# Patient Record
Sex: Male | Born: 1945 | Race: White | Hispanic: No | Marital: Married | Smoking: Current every day smoker
Health system: Southern US, Community
[De-identification: ages and names within clinical notes are randomized; demographics above are authoritative.]

## PROBLEM LIST (undated history)

## (undated) DIAGNOSIS — Z9289 Personal history of other medical treatment: Secondary | ICD-10-CM

## (undated) DIAGNOSIS — M479 Spondylosis, unspecified: Secondary | ICD-10-CM

## (undated) DIAGNOSIS — M21371 Foot drop, right foot: Secondary | ICD-10-CM

## (undated) DIAGNOSIS — I10 Essential (primary) hypertension: Secondary | ICD-10-CM

## (undated) DIAGNOSIS — I2510 Atherosclerotic heart disease of native coronary artery without angina pectoris: Secondary | ICD-10-CM

## (undated) HISTORY — DX: Personal history of other medical treatment: Z92.89

## (undated) HISTORY — PX: FOOT SURGERY: SHX648

## (undated) HISTORY — DX: Atherosclerotic heart disease of native coronary artery without angina pectoris: I25.10

## (undated) HISTORY — DX: Spondylosis, unspecified: M47.9

## (undated) HISTORY — DX: Essential (primary) hypertension: I10

## (undated) HISTORY — PX: CATARACT EXTRACTION, BILATERAL: SHX1313

## (undated) HISTORY — PX: CORONARY STENT PLACEMENT: SHX1402

## (undated) HISTORY — PX: BACK SURGERY: SHX140

## (undated) HISTORY — PX: CHOLECYSTECTOMY OPEN: SUR202

---

## 2000-11-28 ENCOUNTER — Ambulatory Visit (HOSPITAL_COMMUNITY): Admission: RE | Admit: 2000-11-28 | Discharge: 2000-11-28 | Payer: Self-pay | Admitting: Neurosurgery

## 2000-11-28 ENCOUNTER — Encounter: Payer: Self-pay | Admitting: Neurosurgery

## 2001-01-11 ENCOUNTER — Encounter: Admission: RE | Admit: 2001-01-11 | Discharge: 2001-01-19 | Payer: Self-pay | Admitting: Anesthesiology

## 2001-09-09 ENCOUNTER — Ambulatory Visit (HOSPITAL_COMMUNITY): Admission: RE | Admit: 2001-09-09 | Discharge: 2001-09-10 | Payer: Self-pay | Admitting: Cardiology

## 2002-05-29 ENCOUNTER — Ambulatory Visit (HOSPITAL_COMMUNITY): Admission: RE | Admit: 2002-05-29 | Discharge: 2002-05-29 | Payer: Self-pay | Admitting: Neurosurgery

## 2002-05-29 ENCOUNTER — Encounter: Payer: Self-pay | Admitting: Neurosurgery

## 2002-05-31 ENCOUNTER — Emergency Department (HOSPITAL_COMMUNITY): Admission: EM | Admit: 2002-05-31 | Discharge: 2002-06-01 | Payer: Self-pay | Admitting: Emergency Medicine

## 2002-07-10 ENCOUNTER — Encounter: Admission: RE | Admit: 2002-07-10 | Discharge: 2002-07-10 | Payer: Self-pay | Admitting: Orthopaedic Surgery

## 2002-07-10 ENCOUNTER — Encounter: Payer: Self-pay | Admitting: Orthopaedic Surgery

## 2002-08-25 ENCOUNTER — Encounter: Payer: Self-pay | Admitting: Orthopaedic Surgery

## 2002-08-27 ENCOUNTER — Inpatient Hospital Stay (HOSPITAL_COMMUNITY): Admission: RE | Admit: 2002-08-27 | Discharge: 2002-09-02 | Payer: Self-pay | Admitting: Orthopaedic Surgery

## 2002-08-27 ENCOUNTER — Encounter: Payer: Self-pay | Admitting: Orthopaedic Surgery

## 2002-08-29 ENCOUNTER — Encounter: Payer: Self-pay | Admitting: Orthopaedic Surgery

## 2002-09-01 ENCOUNTER — Encounter: Payer: Self-pay | Admitting: Orthopaedic Surgery

## 2002-09-18 ENCOUNTER — Ambulatory Visit: Admission: RE | Admit: 2002-09-18 | Discharge: 2002-09-18 | Payer: Self-pay | Admitting: Orthopaedic Surgery

## 2010-02-28 ENCOUNTER — Ambulatory Visit: Payer: Self-pay | Admitting: Cardiology

## 2010-10-07 NOTE — Discharge Summary (Signed)
[name], [name]                           ACCOUNT NO.:  [id number]   MEDICAL RECORD NO.:  [id number]                   PATIENT TYPE:  INP   LOCATION:  5021                                 FACILITY:  [facility]   PHYSICIAN:  Sharolyn Douglas, M.D.                     DATE OF BIRTH:  1946/03/02   DATE OF ADMISSION:  08/27/2002  DATE OF DISCHARGE:  09/02/2002                                 DISCHARGE SUMMARY   ADMITTING DIAGNOSIS:  1. 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  2. Hypertension.  3. Coronary artery disease.   DISCHARGE DIAGNOSES:  1. Status post L2-5 revision laminectomy and L2-5 posterior spinal fusion     with pedicle screws.  2. Postoperative hemorrhagic anemia that was stable, asymptomatic and did     not require any blood transfusions.  3. Postoperative mild ileus, resolved prior to discharge.  4. 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  5. Hypertension.  6. Coronary artery disease.   PROCEDURE:  L2-to-L5 revision laminectomy, L2-to-L5 posterior spinal fusion  with pedicle screws; this was done by Dr. Sharolyn Douglas with the assistance of  [facility]  Anesthesia used was general.   CONSULTS:  None.   BRIEF HISTORY:  The patient is a 65 year old man with a long history of back  complaints dating back to 1978 when he underwent L5-S1 laminotomy and  diskectomy for radiculopathy.  He did well following that operation, until  1981, when he developed back and right hip pain.  The patient went on to  have revision laminotomy and posterior spinal fusion, indicating that he had  some improvement since then, however, not complete.  In 1990, he developed  radicular pain, weakness in right lower extremity and foot drop and he  underwent an L4-L5 lumbar laminotomy and diskectomy, postoperatively had  worsening of the weakness in the right lower extremity.  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  He has tried treatments with multiple epidural steroid  injections as well as trigger point injections and excalating doses of  narcotic pain medications, all without any significant relief.  He describes  the pain as a 10/10 on the visual analog scale, is constant in nature,  keeping him up at night, interferes with daily activities as well as work.  Risks and benefits of the procedure were discussed with the patient by Dr.  Sharolyn Douglas.  He indicated understanding and elected to proceed.   LABORATORY DATA:  On August 25, 2002, CBC with differential was within normal  limits with the exception of RDW percent of 15.4, absolute monos of 1, basos  of 2, and absolute basos of 0.2.  CBC postoperatively showed white count of  17.9, red count of 4.01, MCV 100.1, RDW 15, hemoglobin and hematocrit  monitored three  days postoperatively.  It reached a low of 12.0 and 35.4,  respectively, however, he was symptomatic and did not require transfusion.  He remained stable.  PT/INR and PTT on August 25, 2002 within normal limits.  Complete metabolic panel on August 25, 2002 was within normal limits.  Basic  metabolic panel was monitored three days postoperatively.  He had a slight  elevation in his glucose at 147, 156, and 134 during those three days.  He  did have mild hyponatremia at 134 on August 30, 2002.  His BUN was elevated  at 24 on that day as well.  UA from August 25, 2002 was negative.  Blood type  on August 25, 2002 was type A, Rh-positive, antibody screen negative.   On August 29, 2002, portable abdomen showed a dynamic ileus with distended  loops of small and large bowel but negative for free air.  This was repeated  on September 01, 2002 and showed improvement but not complete resolution of  diffuse ileus.   EKG:  There is not one found on the chart.   HOSPITAL COURSE:  Following admission, the patient was taken for the above-  listed procedure.  He tolerated the procedure well without any   intraoperative complications.  He had 1200 mL of blood loss with 450  returned via Cell Saver and one Hemovac drain was placed.  He was  transferred to the recovery room in stable condition.  Upon being admitted  to the hospital, his vital signs were monitored.  Neurovascular checks were  done and he remained intact without any compromise throughout his hospital  stay.  His diet was held at n.p.o. until he passed flatus, at which time he  began having a clear liquid diet.  However, secondary to abdominal  distention, an abdominal x-ray was ordered as previously dictated and he was  found to have an ileus, therefore, he was put back n.p.o.  He was held at  n.p.o. until he had a bowel movement, at which time he was slowly advanced  and did not have any other problems.  Incentive spirometry was used every  one hour.  Foley catheter was inserted preoperatively and continued and  discontinued on postoperative day 2 in the a.m. without any problems.  Kept  is SCDs with mobilization with decreased chance of any DVTs.  Forty-eight  hours of IV antibiotics were completed without difficulty.  Pain control was  obtained initially on a combination of PCA as well as p.o. Percocet.  Pain  control remained adequate throughout his hospital stay.  Home medications  were restarted postoperatively.  Physical therapy and occupational therapy  were consulted to work with the patient on progressive ambulation program,  safety and gait training, as well as back precautions; he did well with them  and was essentially independent prior to discharge.  Hemovac drain was  discontinued on postoperative day 2 without any difficulty.  Operative  dressing was taken down, postoperative day 2, and incision looked good  without any signs or symptoms of infections.  Daily dressing changes were  done thereafter and he continued to look good throughout his hospital stay. Postoperative day 2 was the day that he developed some  abdominal distention.  Postoperative day 3, he did have a bowel movement and by postoperative day  4, he was feeling much better, however, he develop some mild distention of  his abdomen.  He did have another bowel movement on postoperative day 4.  By  postop day  5, he was doing very well and feeling well.  He had had three  bowel movements and his abdomen was not distended and nontender.  His vital  signs were stable and he was afebrile.  Motor and sensation were intact and  unchanged from his preoperative status.  Incision looked good without any  signs of infection.   DISCHARGE /DIAGNOSIS:  [age]-year-old male, status post posterior  spinal fusion.   ACTIVITY:  Progressive ambulation, no lifting greater than 5 pounds.  Back  precautions were discussed and understood.   WOUND CARE:  Dressing changes on a daily basis.  He may shower if there is  no drainage from the incision.   FOLLOWUP:  He is to follow up in two weeks postoperatively with Dr. Noel Gerold  and instructed to call for an appointment.   MEDICATIONS ON DISCHARGE:  1. Mepergan Fortis.  2. Robaxin.  3. Over-the-counter laxative as needed.  4. Over-the-counter Tylenol as needed.  5. Calcium with vitamin D.  6. Multivitamin.  7. Colace.   DIET:  Diet as tolerated, regular home diet.   CONDITION ON DISCHARGE:  Stable and improved.   DISPOSITION:  The patient is being discharged to his home with his family's  assistance.  Home needs and equipment were arranged through [facility].  The patient has met all orthopedic goals and was medically stable and  ready for discharge.      Verlin Fester, P.A.                       Sharolyn Douglas, M.D.    CM/MEDQ  D:  10/09/2002  T:  10/10/2002  Job:  [id number]

## 2010-10-07 NOTE — Discharge Summary (Signed)
[place]. [facility]  Patient:    [name], [name] Visit Number: [id number] MRN: [id number]          Service Type: CAT Location: 6500 [id number] Attending Physician:  [place], Peter Manning Dictated by:   Peter M. [place], M.D. Admit Date:  09/09/2001 Discharge Date: 09/10/2001   CC:         Hadassah Pais. Jeannetta Nap, M.D.   Discharge Summary  HISTORY OF PRESENT ILLNESS:  Mr. Golubski is a 65 year old white male seen for evaluation of chest pain.  He began experiencing symptoms of substernal chest pain in March associated with feeling hot, clammy, and nauseated.  Subsequent Adenosine Cardiolite study showed a fixed apical defect with normal left ventricular function and ejection fraction of 54%.  Due to recurrent chest pain, he was admitted for cardiac catheterization.  The patient does have a history of severe hypertension that has been difficult to control.  PAST MEDICAL HISTORY: SOCIAL HISTORY: FAMILY HISTORY: PHYSICAL EXAMINATION:  For details, please see admission history and physical.  LABORATORY DATA:  EKG showed normal sinus rhythm with borderline interventricular conduction delay.  Chest x-ray showed no active disease. BMET showed a glucose 183, hemoglobin A1c 5.1%.  Other chemistries were normal.  CBC was normal.  Coags were normal.  Triglycerides were 209.  Total cholesterol was 205 with LDL 96, HDL 67.  HOSPITAL COURSE:  The patient was admitted and underwent cardiac catheterization.  This demonstrated single vessel obstructive disease with tandem lesions in the mid and distal right coronary artery.  Both of these lesions were approximately 70%.  The more proximal lesion was very irregular. The second lesion was more fusiform.  Left ventricular function was normal. Abdominal aortography was also normal showing normal renal artery anatomy.  We proceeded at this point with stenting of the right coronary artery.  The lesion at the crux of the right coronary  artery was stented using a 3.0 x 15 mm Zeta stent.  The more proximal lesion was stented using a 3.0 x 13 mm Zeta. This yielded excellent angiographic result.  The patient tolerated the procedure well.  He had no recurrent chest pain.  Follow-up EKG was normal. He did develop a moderate hematoma in his right groin site.  However, at the time of discharge this was soft and nontender.  Pulses were good and he had no bruit. His hemoglobin remained stable at 15.2.  It was felt that the patient was stable for discharge at this point.  The patient did remain hypertensive during his hospital stay.  We recently added Toprol XL to his medical regimen, so will continue with his multidrug regimen for control of his hypertension.  DISCHARGE DIAGNOSES: 1. Angina pectoris. 2. Atherosclerotic coronary artery disease, single vessel disease, status post    successful stenting of the right coronary artery. 3. Severe hypertension. 4. Chronic back pain.  DISCHARGE MEDICATIONS: 1. Trazodone 25 mg q.h.s. 2. Darvocet-N 100 p.r.n. 3. Verapamil 180 mg twice a day. 4. Altace 10 mg twice a day. 5. Toprol XL 50 mg per day. 6. Hydrochlorothiazide 12.5 mg daily. 7. Coated aspirin 325 mg daily. 8. Plavix 75 mg daily for one month.  ACTIVITY:  The patient is to avoid any lifting or straining for one week.  He may return to work on Friday.  DIET:  He is to follow a low fat, low salt diet.  WOUND CARE:  He is to call if he has any increased groin pain or swelling.  FOLLOW-UP:  Will follow up in one week with Dr. [place].  CONDITION ON DISCHARGE:  Improved. Dictated by:   Peter M. [place], M.D. Attending Physician:  [place], Peter Manning DD:  09/10/01 TD:  09/10/01 Job: [id number]

## 2010-10-07 NOTE — Cardiovascular Report (Signed)
[place]. [facility]  Patient:    GIANNIS, CORPUZ Visit Number: [id number] MRN: [id number]          Service Type: CAT Location: 6500 [id number] Attending Physician:  [place], Peter Manning Dictated by:   Peter M. [place], M.D. Proc. Date: 09/09/01 Admit Date:  09/09/2001   CC:         Buren Kos, M.D.   Cardiac Catheterization  INDICATIONS FOR PROCEDURE:  The patient is a 65 year old white male who presents with refractory chest pain.  Previous adenosine Cardiolite study showed fixed apical defect.  The patient also has a history of severe hypertension.  ACCESS:  Via right femoral artery using standard Seldinger technique.  EQUIPMENT: 1. 6-French 4 cm right and left Judkins catheters. 2. 6-French pigtail catheter. 3. 6-French arterial sheath. 4. 7-French arterial sheath. 5. 7-French JR4 guide with side holes. 6. 0.014 Hi-Torque floppy wire. 7. 3.0 x 15 mm Zeta stent. 8. 3.0 x 13 mm Zeta stent.  CONTRAST:  310 cc Omnipaque.  MEDICATIONS: 1. Local anesthesia 1% Xylocaine. 2. Versed 2 mg IV. 3. Heparin 4000 units IV. 4. Nitroglycerin 200 mcg intracoronary x2. 5. Integrilin double bolus, followed by continuous IV infusion. 6. Nitroglycerin drip at 10 mcg/min IV. 7. Plavix 300 mg p.o.  HEMODYNAMIC DATA: 1. Aortic pressure:  145/64 with mean 103. 2. Left ventricular pressure:  145 with EDP 27.  ANGIOGRAPHIC DATA:  The left coronary artery arises and distributes normally. 1. LEFT MAIN CORONARY:  Normal. 2. LEFT ANTERIOR DESCENDING ARTERY:  Has 30% disease proximally.  In the mid    vessel at an angle there is a 40% stenosis of the LAD. 3. LEFT CIRCUMFLEX CORONARY ARTERY:  Demonstrates mild, diffuse irregularities    less than 10%. 4. RIGHT CORONARY ARTERY:  Arises and distributes normally.  It is a    dominant vessel.  Proximal to mid vessel there is an irregular, eccentric    stenosis up to 70-80%.  At the crux of the right coronary artery  there is    a long tubular 60-70% stenosis.  There is a 40% stenosis in the PDA.  LEFT VENTRICULOGRAPHY:  Performed in the RAO view.  Demonstrates normal left ventricular size and function, with ejection fraction estimated at 65%.  There is no mitral valve prolapse or regurgitation.  ABDOMINAL AORTOGRAPHY:  This demonstrated normal aortic caliber, without aneurysmal formation.  The renal arteries are very large in caliber, without significant stenosis.  The celiac arteries also appear normal.  The iliac arteries are tortuous, without significant disease.  INTERVENTIONAL PROCEDURE:  We proceeded at this point with intervention of the right coronary artery stenoses.  After the patient was medicated we directly stented both the lesions in the right coronary artery, both in the proximal to mid vessel and at the crux.  The lesion in the crux was stented first, using the 3.0 x 15 mm Zeta stent.  This was deployed at 8 atm and post-dilated to 14 atm, with an excellent angiographic result (less than 0% residual stenosis).  We then proceeded with stenting in the proximal lesion.  This was stented using a 3.0 x 13 mm Zeta stent.  It was deployed at 8 atm and then post-dilated to 14 atm, and then to 16 atm.  Again, an excellent angiographic result was obtained, with 0% residual stenosis.  FINAL INTERPRETATION: 1. Single-vessel obstructive atherosclerotic coronary artery disease. 2. Normal left ventricular function. 3. Normal abdominal aortography. 4. Successful stenting of tandem  lesions in the right coronary artery. Dictated by:   Peter M. [place], M.D. Attending Physician:  [place], Peter Manning DD:  09/09/01 TD:  09/10/01 Job: [id number]

## 2010-10-07 NOTE — Op Note (Signed)
   NAME:  [name], [name]                           ACCOUNT NO.:  [id number]   MEDICAL RECORD NO.:  [id number]                   PATIENT TYPE:  INP   LOCATION:  5021                                 FACILITY:  [facility]   PHYSICIAN:  Sharolyn Douglas, M.D.                     DATE OF BIRTH:  1946/05/18   DATE OF PROCEDURE:  08/27/2002  DATE OF DISCHARGE:                                 OPERATIVE REPORT   ADDENDUM:  It should be noted that the L5-S1 fusion was explored by exposing  the previous fusion mask.  No clefts were identified.  The spinous process  of L5 and S1 was grasped with a towel clip and we could not produce any  motion at this level.  The fusion appeared to be solid.                                               Sharolyn Douglas, M.D.    MC/MEDQ  D:  08/27/2002  T:  08/28/2002  Job:  [id number]

## 2010-10-07 NOTE — Op Note (Signed)
NAME:  [name], [name]                           ACCOUNT NO.:  [id number]   MEDICAL RECORD NO.:  [id number]                   PATIENT TYPE:  INP   LOCATION:  5021                                 FACILITY:  [facility]   PHYSICIAN:  Sharolyn Douglas, M.D.                     DATE OF BIRTH:  1946-01-25   DATE OF PROCEDURE:  08/27/2002  DATE OF DISCHARGE:                                 OPERATIVE REPORT   PREOPERATIVE DIAGNOSIS:  1. Chronic back and right lower extremity pain secondary to severe     degenerative disk disease and spinal stenosis.  2. Status post L4-5 and L5-S1 laminectomy and L5-S1 in situ.  3. Chronic right foot drop.   POSTOPERATIVE DIAGNOSIS:  1. Chronic back and right lower extremity pain secondary to severe     degenerative disk disease and spinal stenosis.  2. Status post L4-5 and L5-S1 laminectomy and L5-S1 in situ.  3. Chronic right foot drop.   OPERATION PERFORMED:  1. Revision, lumbar laminectomy, L2 through L5, decompression of the L2, 3,     4, and 5 nerve roots bilaterally.  2. Exploration of fusion of L5-S1.  3. Posterior spinal arthrodesis L2 through L5.  4. Pedicle screw instrumentation segmental L2 through L5 utilizing the     Spinal Concepts and Compass system.  5. Local autogenous bone graft supplemented with 10 cc with Pro-Osteon bone     graft substitute and platelet rich plasma.  6. Neuro monitoring utilizing the NeuroVision system with testing of eight     pedicle screws and monitoring of free running EMGs for three hours.   SURGEON:  Sharolyn Douglas, M.D.   ASSISTANT:  Real Cons, P.A.   ANESTHESIA:  General endotracheal.   COMPLICATIONS:  None.   INDICATIONS FOR PROCEDURE:  The patient is a 65 year old male who is status  post three lumbar surgeries including L5-S1 laminectomies, diskectomy and in  situ fusion followed by an L4-5 lumbar laminotomy and diskectomy.  He has a  chronic right foot drop status post his third lumbar surgery.  Over the  past  several years, he has developed increasing low back pain with intractable  pain in the right lower extremity.  He has been taking escalating doses of  narcotics.  There has been significant deterioration in his function.  He is  ambulating with pain, having difficulties performing his activities of daily  living.  He was worked up with CT myelogram, MRI scan and plain x-rays.  He  has severe degenerative changes throughout the lumbar spine.  There are  varying degrees of foraminal and lateral recess stenosis, L2 through S1.  There is an extruded disk fragment at L3-4 on the right.  There is epidural  fibrosis about the previous operated levels.  Because of his continued pain  and failure to respond to conservative treatment.  He has elected to undergo  revision, lumbar decompression and fusion, L2 to L5 with exploration of the  L5-S1 fusion in hopes of improving his symptoms.   DESCRIPTION OF PROCEDURE:  The patient was properly identified in the  holding area and taken to the operating room. He underwent general  endotracheal anesthesia without difficulty.  He was given prophylactic  intravenous antibiotics.  He was carefully turned prone onto the Acromed  four poster positioning free.  All bony prominences were padded.  The face  and eyes were protected at all times.  The previous midline incision was  utilized and extended several centimeters proximally.  Dissection was  carried down to the deep fascia.  The deep fascia was incised and the  paraspinal muscles were subperiosteally elevated out to the tips of the  transverse processes of L2, 3, and 4.  There was an extensive amount of  scarring about the L5-S1 level.  The transverse processes of L5 were encased  in the previous fusion mass.  The facet joints were found to be very  hypertrophic at L2-3, L3 and L4-5.  We placed our deep retractor.  We  identified the previous laminectomy defect at L4-5 and L5-S1 using a curet.  There  was an extensive amount of epidural fibrosis.  The spinal canal was  entered and using Kerrisons, and curets we were able to perform a revision  central decompression.  We removed the residual lamina of L4 and 5.  We then  performed a laminectomy of L2 and 3.  The interspinous ligament was  preserved between L1 and 2.  We removed approximately half of the L2 spinous  process and inferior one third of the L2 lamina.  We then performed a  lateral recess decompression bilaterally.  On the right side, we encountered  scarring, worse at L3-4 and L4-5.  Using loupes and headlight magnification,  we performed a neurolysis, completely decompressing the L2, 3, 4, and 5  nerve roots bilaterally.  An extruded disk fragment was identified at the L3-  4 level with compression on the L4 nerve root.  This was removed using  pituitary rongeurs.  We were not able to enter the disk space because of  posterior osteophytes.  When we were done with the decompression, each nerve  was completely free from its take off all the way out its respective foramen  which we confirmed with a blunt probe.  We then turned our attention to  placing pedicle screws at L2, 3, 4, and 5 using anatomic probing technique  and fluoroscopy.  Each pedicle starting point was identified and initiated  with the awl.  The Steffee pedicle probe was used to cannulate the pedicle.  We used a ball tip probe to confirm there were no breaches.  We could also  palpate the pedicles from within the spinal canal.  We then tapped and  placed the screws.  We placed 5.5 x 45 mm screws at L2 bilaterally, 5.5 x 45  mm screws at L3 bilaterally, 6.5 x 50 mm screws at L4 and 6.5 x 45 mm screws  at L5 bilaterally.  Each pedicle screw was then stimulated using triggered  EMGs and in each case we had a response at greater than 20 ma consistent  with interosseous placement of the screws.  We then placed the titanium rods and the appropriate locking screws.  The  locking screws were torqued off.  We placed two cross connectors.  We then decorticated the transverse  processes of L2, 3,  4 as well as the previous fusion mass at L5.  The pars  interarticularis and the lateral facets were also decorticated.  We then  packed local autogenous bone graft that had been collected from the  laminectomy defect and cleaned of all soft tissue, mixed with 10 ml of Pro-  Osteon bone graft substitute and platelet rich plasma into the lateral  gutters. We inspected the dura where the neurolysis had been performed.  There were no lacerations or leakage of spinal fluid.  Nevertheless we  placed a seal over the exposed epidural space as there were several areas of  pain where the epidural fibrosis had been dissected.  We then placed Gelfoam  over the dura.  A deep Hemovac drain was placed.  The platelet poor plasma  was then sprayed into the muscle laterally and soft tissues.  There were no  deleterious changes in the free running EMGs throughout the procedure.  We  then closed the wound in layers with a #1 Vicryl in the deep fascia followed  by a 2-0 Vicryl in the subcutaneous layer and a running 3-0 nylon to  approximate the skin.  Once again, the platelet poor plasma was sprayed over  the incision.  A sterile dressing was applied.  The patient was turned  supine, extubated without difficulty, transferred to the recovery room in  stable condition able to move his upper and lower extremities.                                                Sharolyn Douglas, M.D.    MC/MEDQ  D:  08/27/2002  T:  08/28/2002  Job:  [id number]

## 2010-10-07 NOTE — H&P (Signed)
NAME:  [name], [name]                           ACCOUNT NO.:  [id number]   MEDICAL RECORD NO.:  [id number]                   PATIENT TYPE:  INP   LOCATION:  5021                                 FACILITY:  [facility]   PHYSICIAN:  Sharolyn Douglas, M.D.                     DATE OF BIRTH:  04-12-1946   DATE OF ADMISSION:  08/27/2002  DATE OF DISCHARGE:                                HISTORY & PHYSICAL   CHIEF COMPLAINT:  Back and right lower extremity pain.   HISTORY OF PRESENT ILLNESS:  The patient is a 65 year old male who has had a  long history of back complaints dating back to 1978 when he underwent an L5-  S1 laminotomy and diskectomy for radiculopathy.  He did well following that  operation until 1981.  He developed back and right hip pain.  He eventually  went on to a revision laminotomy and posterior fusion.  Again, he had some  improvement with his symptoms although not complete.  In 1990, he developed  radicular pain, weakness in the right lower extremity with foot drop and he  underwent L4-5 lumbar laminotomy and diskectomy and postoperatively had a  worsening of the weakness.  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  He describes his pain as a 10/10 on a visual analog  scale.  Secondary to the patient's findings on the CT myelogram, as well as  his clinical findings and history, it was thought that his best course of  management would be a revision laminectomy at L2-5 and a posterior spinal  fusion at L3-5.  Risks and benefits of the surgery were discussed with the  patient by Dr. Sharolyn Douglas.  He indicated understanding and opted to proceed.   ALLERGIES:  No known drug allergies.   MEDICATIONS:  1. Norvasc 5 mg p.o. daily.  2. Lisinopril 40 mg p.o. daily.  3. Toprol XL 50 mg p.o. daily.  4.  Hydrochlorothiazide 25 mg p.o. daily.  5. Trazodone 25 mg p.o. daily.  6. Prednisone up to 60 mg p.o. daily.  7. Vicodin p.r.n.  8. Aspirin, which he stopped the aspirin in preparation for surgery.   PAST MEDICAL HISTORY:  1. Hypertension.  2. Coronary artery disease with two stents.   PAST SURGICAL HISTORY:  Back surgery in 1978, 1982, and 1990.  Cholecystectomy, as well as cardiac stent placement in April 2003.   SOCIAL HISTORY:  The patient smokes one pack of cigarettes per day.  He is  trying to cut down and quit in preparation for surgery.  Alcohol:  He drinks  rarely on a social basis.  He is married.  Currently unemployed secondary to  his disabling back problems.  His  wife will be available to help him through  his postoperative course.   FAMILY HISTORY:  Noncontributory.   REVIEW OF SYSTEMS:  The patient denies any fevers, chills, sweats, or  bruising tendencies.  CNS:  Denies blurred vision, double vision, seizure,  headache, paralysis.  CARDIOVASCULAR:  Denies chest pain, angina, orthopnea,  claudication, or palpitations.  PULMONARY:  Denies shortness of breath or  hemoptysis.  GI:  Denies nausea, vomiting, constipation, diarrhea, melena,  or bloody stool.  GU:  Denies dysuria, hematuria, or discharge.  MUSCULOSKELETAL:  As per HPI.   PHYSICAL EXAMINATION:  VITAL SIGNS:  Blood pressure is 180/100, respirations  at 18 and unlabored, pulse is 82 and regular.  GENERAL:  This is a 65 year old white male who is alert and oriented in no  acute distress.  He is well-nourished, well-developed, appears stated age.  Pleasant and cooperative to exam.  He is a bit anxious throughout our  conversation today.  HEENT:  Head is normocephalic, atraumatic.  Pupils are equal, round, and  reactive.  Extraocular movements intact.  Nares patent.  Fundus clear.  NECK:  Soft to palpation.  No bruits or lymphadenopathy or thyromegaly  noted.  CHEST:  Clear to auscultation bilaterally.  No  rales, rhonchi, or wheezes.  BREASTS:  Not pertinent, not performed.  HEART:  S1, S2.  Regular rate and rhythm.  No murmurs, gallops, or rubs  noted.  ABDOMEN:  Soft to palpation, nontender, nondistended, no organomegaly noted.  GENITOURINARY:  Not pertinent, not performed.  EXTREMITIES:  Motor and sensation are grossly intact; however, he does have  right-sided foot drop.  Antalgic gait on the right.  He has 0/5 strength in  the right tibialis anterior and 2/5 in the right EHL.  Gastrocnemius is 4/5.  He has 4-/5 quadriceps strength.  This is all on the right side.  Straight  leg raising is positive on the right for leg pain.  Sensation is decreased  in the right L5 distribution.  Reflexes are 2+ at the knees, hypoactive at  the ankles.  SKIN:  Intact without lesions or rashes.   LABORATORY DATA:  X-ray and CT myelogram shows degenerative disk disease and  spinal stenosis.   IMPRESSION:  1. 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  2. Hypertension.  3. Coronary artery disease.   PLAN:  1. The patient will be admitted to [facility] on [facility] 27, 2002 to     undergo an L2-5 revision laminectomy, as well as L2-5 posterior spinal     fusion.  It will be done by Dr. Sharolyn Douglas.  2. The patient's primary care physician is Dr. Jeannetta Nap.  3. The patient's cardiologist is Dr. [place] and he has cleared him in     preparation for surgery.  Should we run into any medical or cardiac     problems, we will consult the appropriate physician.     Verlin Fester, P.A.                       Sharolyn Douglas, M.D.    CM/MEDQ  D:  08/27/2002  T:  08/28/2002  Job:  [id number]

## 2010-10-07 NOTE — H&P (Signed)
[name]. [facility]  Patient:    [name], [name] Visit Number: [id number] MRN: [id number]          Service Type: CAT Location: [id number] Attending Physician:  Moran, Donald Manning Dictated by:   Donald M. Moran, M.D. Admit Date:  09/09/2001 Discharge Date: 09/10/2001   CC:         [name], M.D.   History and Physical  DATE OF BIRTH:  [birth year]  HISTORY OF PRESENT ILLNESS:  [name] is a 65 year old white male seen recently for evaluation of chest pain.  The patient does have chronic back pain and has been on chronic pain medications.  In March, the patient began experiencing symptoms of substernal chest discomfort.  This was associated with feeling hot, clammy and nauseated.  His symptoms lasted 10 to 15 minutes. He had recurrent symptoms the following morning.  In addition to these symptoms, he complained of some wheezing.  He also complains of some discomfort in his upper abdomen.  His blood pressure control has been poor. The patient subsequently underwent an adenosine Cardiolite study.  This demonstrated a fixed apical defect.  There was no definite ischemia.  Left ventricular function was normal with an ejection fraction of 54%.  Since his Cardiolite study, he has had recurrent chest pain.  This is described as a substernal chest soreness.  He had some associated sweats and feeling fatigued.  Because of his refractory pain and the fact that his Cardiolite study showed an apical defect, he is now admitted for cardiac catheterization.  PAST MEDICAL HISTORY:  Past medical history is significant for severe hypertension.  The patient has chronic back problems and has had previous surgery x3.  He has had multiple epidural injections.  He has a history of chronic left foot drop.  ALLERGIES:  He has no known allergies.  CURRENT MEDICATIONS: 1. Trazodone 25 mg q.h.s. 2. Darvocet-N 100 -- up to six tablets a day. 3. Verapamil  180 mg twice a day. 4. Altace 10 mg twice a day. 5. HCTZ 12.5 daily.  ALLERGIES:  No known allergies.  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  He continues to smoke but less than one pack per day.  He denies alcohol use.  FAMILY HISTORY:  Father has had previous prior myocardial infarction; he also had cancer and diabetes.  Mother died at age 20, post redo bypass surgery.  REVIEW OF SYSTEMS:  Review of systems, as related, is significant for chronic back pain and leg weakness.  Denies orthopnea or PND.  He has had no claudication.  Other review of systems are negative.  PHYSICAL EXAMINATION:  GENERAL:  The patient is an overweight white male in no distress.  Weight is 196.  VITAL SIGNS:  Blood pressure is 186/110.  Pulse is 74 and regular.  His respirations are normal.  HEENT:  Pupils equal, round and reactive to light and accommodation. Extraocular movements are full.  Funduscopic exam reveals hypertensive AV nicking.  Oropharynx is clear.  NECK:  Without JVD, adenopathy, thyromegaly or bruits.  LUNGS:  Clear.  CARDIAC:  Exam reveals a regular rate and rhythm, positive S4 and a soft grade 1/6 systolic murmur heard best at the apex.  ABDOMEN:  Soft and nontender.  There are no masses or bruits.  EXTREMITIES:  Without edema.  NEUROLOGIC:  Exam is nonfocal.  LABORATORY AND ACCESSORY DATA:  ECG shows normal sinus rhythm with borderline  intraventricular conduction delay.  Chest x-ray shows no active disease.  IMPRESSION: 1. Chest pain.  Cardiolite study demonstrates a fixed apical defect.  Question    whether his chest pain is related to ischemia versus hypertensive heart    disease. 2. Severe hypertension. 3. Chronic back pain. 4. Previous cholecystectomy.  PLAN:  I have added Toprol-XL 50 mg per day to his antihypertensive therapy. Recommend that the patient take an aspirin daily.  He will be admitted for coronary  angiography. Dictated by:   Donald M. Moran, M.D. Attending Physician:  Moran, Donald Manning DD:  09/04/01 TD:  09/05/01 Job: [phone] [id number]

## 2011-09-13 ENCOUNTER — Encounter: Payer: Self-pay | Admitting: *Deleted

## 2011-11-09 ENCOUNTER — Encounter: Payer: Self-pay | Admitting: Cardiology

## 2012-11-11 ENCOUNTER — Ambulatory Visit
Admission: RE | Admit: 2012-11-11 | Discharge: 2012-11-11 | Disposition: A | Discharge status: Home / Self Care | Payer: Medicare Other | Source: Ambulatory Visit | Attending: Pain Medicine | Admitting: Pain Medicine

## 2012-11-11 ENCOUNTER — Other Ambulatory Visit: Payer: Self-pay | Admitting: Pain Medicine

## 2012-11-11 DIAGNOSIS — M25562 Pain in left knee: Secondary | ICD-10-CM

## 2012-11-11 DIAGNOSIS — M21371 Foot drop, right foot: Secondary | ICD-10-CM

## 2015-11-20 DIAGNOSIS — Z9289 Personal history of other medical treatment: Secondary | ICD-10-CM

## 2015-11-20 HISTORY — DX: Personal history of other medical treatment: Z92.89

## 2015-11-30 ENCOUNTER — Encounter (HOSPITAL_COMMUNITY): Payer: Self-pay | Admitting: *Deleted

## 2015-11-30 ENCOUNTER — Emergency Department (HOSPITAL_COMMUNITY): Payer: Medicare Other

## 2015-11-30 ENCOUNTER — Inpatient Hospital Stay (HOSPITAL_COMMUNITY)
Admission: EM | Admit: 2015-11-30 | Discharge: 2015-12-02 | DRG: 378 | Disposition: A | Discharge status: Home / Self Care | Payer: Medicare Other | Attending: Internal Medicine | Admitting: Internal Medicine

## 2015-11-30 DIAGNOSIS — I2510 Atherosclerotic heart disease of native coronary artery without angina pectoris: Secondary | ICD-10-CM | POA: Diagnosis present

## 2015-11-30 DIAGNOSIS — K922 Gastrointestinal hemorrhage, unspecified: Secondary | ICD-10-CM | POA: Diagnosis not present

## 2015-11-30 DIAGNOSIS — K921 Melena: Secondary | ICD-10-CM

## 2015-11-30 DIAGNOSIS — Z955 Presence of coronary angioplasty implant and graft: Secondary | ICD-10-CM

## 2015-11-30 DIAGNOSIS — E876 Hypokalemia: Secondary | ICD-10-CM | POA: Diagnosis present

## 2015-11-30 DIAGNOSIS — I10 Essential (primary) hypertension: Secondary | ICD-10-CM | POA: Diagnosis present

## 2015-11-30 DIAGNOSIS — E860 Dehydration: Secondary | ICD-10-CM | POA: Diagnosis present

## 2015-11-30 DIAGNOSIS — N179 Acute kidney failure, unspecified: Secondary | ICD-10-CM | POA: Diagnosis present

## 2015-11-30 DIAGNOSIS — D62 Acute posthemorrhagic anemia: Secondary | ICD-10-CM | POA: Diagnosis present

## 2015-11-30 DIAGNOSIS — K264 Chronic or unspecified duodenal ulcer with hemorrhage: Secondary | ICD-10-CM | POA: Diagnosis present

## 2015-11-30 DIAGNOSIS — K2970 Gastritis, unspecified, without bleeding: Secondary | ICD-10-CM | POA: Diagnosis present

## 2015-11-30 DIAGNOSIS — M545 Low back pain: Secondary | ICD-10-CM | POA: Diagnosis present

## 2015-11-30 DIAGNOSIS — G8929 Other chronic pain: Secondary | ICD-10-CM | POA: Diagnosis present

## 2015-11-30 DIAGNOSIS — F17210 Nicotine dependence, cigarettes, uncomplicated: Secondary | ICD-10-CM | POA: Diagnosis present

## 2015-11-30 DIAGNOSIS — Z7982 Long term (current) use of aspirin: Secondary | ICD-10-CM | POA: Diagnosis not present

## 2015-11-30 DIAGNOSIS — Z8249 Family history of ischemic heart disease and other diseases of the circulatory system: Secondary | ICD-10-CM

## 2015-11-30 HISTORY — DX: Foot drop, right foot: M21.371

## 2015-11-30 LAB — PREPARE RBC (CROSSMATCH)

## 2015-11-30 LAB — HEMOGLOBIN AND HEMATOCRIT, BLOOD
HCT: 20.4 % — ABNORMAL LOW (ref 39.0–52.0)
Hemoglobin: 6.4 g/dL — CL (ref 13.0–17.0)

## 2015-11-30 LAB — BASIC METABOLIC PANEL
ANION GAP: 9 (ref 5–15)
BUN: 35 mg/dL — AB (ref 6–20)
CALCIUM: 8.8 mg/dL — AB (ref 8.9–10.3)
CO2: 23 mmol/L (ref 22–32)
CREATININE: 1.68 mg/dL — AB (ref 0.61–1.24)
Chloride: 107 mmol/L (ref 101–111)
GFR calc Af Amer: 46 mL/min — ABNORMAL LOW (ref >60)
GFR, EST NON AFRICAN AMERICAN: 39 mL/min — AB (ref >60)
GLUCOSE: 121 mg/dL — AB (ref 65–99)
Potassium: 3.3 mmol/L — ABNORMAL LOW (ref 3.5–5.1)
Sodium: 139 mmol/L (ref 135–145)

## 2015-11-30 LAB — CBC
HCT: 19.9 % — ABNORMAL LOW (ref 39.0–52.0)
Hemoglobin: 6.3 g/dL — CL (ref 13.0–17.0)
MCH: 29.9 pg (ref 26.0–34.0)
MCHC: 31.7 g/dL (ref 30.0–36.0)
MCV: 94.3 fL (ref 78.0–100.0)
PLATELETS: 460 10*3/uL — AB (ref 150–400)
RBC: 2.11 MIL/uL — ABNORMAL LOW (ref 4.22–5.81)
RDW: 17 % — AB (ref 11.5–15.5)
WBC: 11.3 10*3/uL — AB (ref 4.0–10.5)

## 2015-11-30 LAB — HEPATIC FUNCTION PANEL
ALT: 19 U/L (ref 17–63)
AST: 16 U/L (ref 15–41)
Albumin: 3.6 g/dL (ref 3.5–5.0)
Alkaline Phosphatase: 68 U/L (ref 38–126)
BILIRUBIN TOTAL: 0.5 mg/dL (ref 0.3–1.2)
Total Protein: 6.2 g/dL — ABNORMAL LOW (ref 6.5–8.1)

## 2015-11-30 LAB — MRSA PCR SCREENING: MRSA by PCR: NEGATIVE

## 2015-11-30 LAB — ABO/RH: ABO/RH(D): A POS

## 2015-11-30 LAB — POC OCCULT BLOOD, ED
FECAL OCCULT BLD: POSITIVE — AB
Fecal Occult Bld: POSITIVE — AB

## 2015-11-30 MED ORDER — MORPHINE SULFATE ER 15 MG PO TBCR
15.0000 mg | EXTENDED_RELEASE_TABLET | Freq: Two times a day (BID) | ORAL | Status: DC
  Administered 2015-11-30 – 2015-12-02 (×4): 15 mg via ORAL
  Filled 2015-11-30 (×4): qty 1

## 2015-11-30 MED ORDER — OXYCODONE HCL 5 MG PO TABS
5.0000 mg | ORAL_TABLET | ORAL | Status: DC | PRN
  Administered 2015-11-30 – 2015-12-01 (×2): 5 mg via ORAL
  Filled 2015-11-30 (×2): qty 1

## 2015-11-30 MED ORDER — ONDANSETRON HCL 4 MG/2ML IJ SOLN: 4.0000 mg | Freq: Four times a day (QID) | INTRAMUSCULAR | Status: DC | PRN

## 2015-11-30 MED ORDER — ADULT MULTIVITAMIN W/MINERALS CH
1.0000 | ORAL_TABLET | Freq: Every day | ORAL | Status: DC
  Administered 2015-11-30 – 2015-12-02 (×3): 1 via ORAL
  Filled 2015-11-30 (×3): qty 1

## 2015-11-30 MED ORDER — ACETAMINOPHEN 325 MG PO TABS: 650.0000 mg | ORAL_TABLET | Freq: Four times a day (QID) | ORAL | Status: DC | PRN

## 2015-11-30 MED ORDER — SODIUM CHLORIDE 0.9 % IV BOLUS (SEPSIS)
500.0000 mL | Freq: Once | INTRAVENOUS | Status: AC
  Administered 2015-11-30: 500 mL via INTRAVENOUS

## 2015-11-30 MED ORDER — SODIUM CHLORIDE 0.9 % IV SOLN
10.0000 mL/h | Freq: Once | INTRAVENOUS | Status: AC
  Administered 2015-11-30: 10 mL/h via INTRAVENOUS

## 2015-11-30 MED ORDER — PANTOPRAZOLE SODIUM 40 MG IV SOLR
40.0000 mg | Freq: Two times a day (BID) | INTRAVENOUS | Status: DC
  Administered 2015-12-01: 40 mg via INTRAVENOUS
  Filled 2015-11-30: qty 40

## 2015-11-30 MED ORDER — ONDANSETRON HCL 4 MG PO TABS
4.0000 mg | ORAL_TABLET | Freq: Four times a day (QID) | ORAL | Status: DC | PRN
Start: 2015-11-30 — End: 2015-12-02

## 2015-11-30 MED ORDER — ACETAMINOPHEN 650 MG RE SUPP: 650.0000 mg | Freq: Four times a day (QID) | RECTAL | Status: DC | PRN

## 2015-11-30 MED ORDER — SODIUM CHLORIDE 0.9 % IV SOLN
80.0000 mg | Freq: Once | INTRAVENOUS | Status: AC
  Administered 2015-11-30: 80 mg via INTRAVENOUS
  Filled 2015-11-30: qty 80

## 2015-11-30 MED ORDER — MORPHINE SULFATE (PF) 2 MG/ML IV SOLN
1.0000 mg | INTRAVENOUS | Status: DC | PRN
  Administered 2015-11-30: 1 mg via INTRAVENOUS
  Filled 2015-11-30: qty 1

## 2015-11-30 MED ORDER — POTASSIUM CHLORIDE 10 MEQ/100ML IV SOLN: 10.0000 meq | INTRAVENOUS | Status: DC

## 2015-11-30 MED ORDER — POTASSIUM CHLORIDE 10 MEQ/100ML IV SOLN
10.0000 meq | Freq: Once | INTRAVENOUS | Status: AC
  Administered 2015-11-30: 10 meq via INTRAVENOUS
  Filled 2015-11-30: qty 100

## 2015-11-30 MED ORDER — SODIUM CHLORIDE 0.9 % IV SOLN
INTRAVENOUS | Status: DC
  Administered 2015-11-30 – 2015-12-01 (×2): via INTRAVENOUS

## 2015-11-30 NOTE — ED Notes (Signed)
Hgb 6.3 per lab.

## 2015-11-30 NOTE — H&P (Signed)
History and Physical    [name] ZOX:[id number] DOB: Apr 22, 1946 DOA: 11/30/2015  PCP: Kaleen Mask, MD Patient coming from: Home    Chief Complaint: Weakness, Abnormal labs, Black stool,   HPI: [name] is a 70 y.o. male with medical history significant of HTN, Chronic pain on morphine, takes occasionally ibuprofen, who presents to ED refer by his PCP for evaluation of melena and low hb. He relates that since the weekend of July 4 th he has been having black stool. He also has been progressively feeling weak and tired over last week, report  A syncope episode the weekend of July 4th. He denies chest pain, dyspnea, abdominal pain. No hematemesis.  He was evaluated today by his PCP, hb was low and patient was refer for admission.   ED Course: Presents with weakness, melena. Hb at 6. 3, K at 3.3, cr 1.63, WBC 11, Platelet 460, NL LFT, fecal occult positive, Chest x ray ; No acute abnormality. Minimal linear atelectasis or scarring at the left lung base. Diffusely enlarged and tortuous thoracic aorta and probable enlargement and tortuosity of the innominate artery. The size of the aortic could be better determined with a chest CT with contrast. Marked right shoulder degenerative changes.   Review of Systems: As per HPI otherwise 10 point review of systems negative.    Past Medical History  Diagnosis Date  . Coronary artery disease   . Hypertension   . Degenerative arthritis of spine     Past Surgical History  Procedure Laterality Date  . Cholecystectomy    . Coronary stent placement    . Back surgery    . Foot surgery     Social History;   reports that he has been smoking.  He does not have any smokeless tobacco history on file. He reports that he does not drink alcohol. His drug history is not on file.  No Known Allergies  Family History  Problem Relation Age of Onset  . Heart attack    . Coronary artery disease       Prior to Admission medications     Medication Sig Start Date End Date Taking? Authorizing Provider  amLODipine (NORVASC) 10 MG tablet Take 10 mg by mouth daily.    Historical Provider, MD  metoprolol succinate (TOPROL-XL) 50 MG 24 hr tablet Take 50 mg by mouth daily. Take with or immediately following a meal.    Historical Provider, MD  nitroGLYCERIN (NITROSTAT) 0.4 MG SL tablet Place 0.4 mg under the tongue every 5 (five) minutes as needed.    Historical Provider, MD    Physical Exam: Filed Vitals:   11/30/15 1307 11/30/15 1433 11/30/15 1500  BP: 96/69 91/65 110/66  Pulse: 92 101 91  Temp: 98.1 F (36.7 C)    TempSrc: Oral    Resp: SpO2: 95% 100% 100%      Constitutional: NAD, calm, comfortable, pale.  Filed Vitals:   11/30/15 1307 11/30/15 1433 11/30/15 1500  BP: 96/69 91/65 110/66  Pulse: 92 101 91  Temp: 98.1 F (36.7 C)    TempSrc: Oral    Resp: SpO2: 95% 100% 100%   Eyes: PERRL, lids and conjunctivae normal ENMT: Mucous membranes are moist. Posterior pharynx clear of any exudate or lesions.Normal dentition.  Neck: normal, supple, no masses, no thyromegaly Respiratory: clear to auscultation bilaterally, no wheezing, no crackles. Normal respiratory effort. No accessory muscle use.  Cardiovascular: Regular rate and rhythm, no  murmurs / rubs / gallops. No extremity edema. 2+ pedal pulses. No carotid bruits.  Abdomen: no tenderness, no masses palpated. No hepatosplenomegaly. Bowel sounds positive.  Musculoskeletal: no clubbing / cyanosis. No joint deformity upper and lower extremities. Good ROM, no contractures. Normal muscle tone.  Skin: no rashes, lesions, ulcers. No induration Neurologic: CN 2-12 grossly intact. Sensation intact, DTR normal. Strength 5/5 in all 4.  Psychiatric: Normal judgment and insight. Alert and oriented x 3. Normal mood.    Labs on Admission: I have personally reviewed following labs and imaging studies  CBC:  Recent Labs Lab 11/30/15 1358  WBC 11.3*   HGB 6.3*  HCT 19.9*  MCV 94.3  PLT 460*   Basic Metabolic Panel:  Recent Labs Lab 11/30/15 1358  NA 139  K 3.3*  CL 107  CO2 23  GLUCOSE 121*  BUN 35*  CREATININE 1.68*  CALCIUM 8.8*   GFR: CrCl cannot be calculated (Unknown ideal weight.). Liver Function Tests:  Recent Labs Lab 11/30/15 1407  AST 16  ALT 19  ALKPHOS 68  BILITOT 0.5  PROT 6.2*  ALBUMIN 3.6   No results for input(s): LIPASE, AMYLASE in the last 168 hours. No results for input(s): AMMONIA in the last 168 hours. Coagulation Profile: No results for input(s): INR, PROTIME in the last 168 hours. Cardiac Enzymes: No results for input(s): CKTOTAL, CKMB, CKMBINDEX, TROPONINI in the last 168 hours. BNP (last 3 results) No results for input(s): PROBNP in the last 8760 hours. HbA1C: No results for input(s): HGBA1C in the last 72 hours. CBG: No results for input(s): GLUCAP in the last 168 hours. Lipid Profile: No results for input(s): CHOL, HDL, LDLCALC, TRIG, CHOLHDL, LDLDIRECT in the last 72 hours. Thyroid Function Tests: No results for input(s): TSH, T4TOTAL, FREET4, T3FREE, THYROIDAB in the last 72 hours. Anemia Panel: No results for input(s): VITAMINB12, FOLATE, FERRITIN, TIBC, IRON, RETICCTPCT in the last 72 hours. Urine analysis: No results found for: COLORURINE, APPEARANCEUR, LABSPEC, PHURINE, GLUCOSEU, HGBUR, BILIRUBINUR, KETONESUR, PROTEINUR, UROBILINOGEN, NITRITE, LEUKOCYTESUR Sepsis Labs: !!!!!!!!!!!!!!!!!!!!!!!!!!!!!!!!!!!!!!!!!!!! @LABRCNTIP (procalcitonin:4,lacticidven:4) )No results found for this or any previous visit (from the past 240 hour(s)).   Radiological Exams on Admission: Dg Chest 2 View  11/30/2015  CLINICAL DATA:  Shortness of breath.  Dizziness. EXAM: CHEST  2 VIEW COMPARISON:  Report dated 08/25/2002. FINDINGS: The cardiac silhouette is near the upper limit of normal in size. Tortuous and enlarged thoracic aorta. There is also evidence of innominate artery enlargement and  tortuosity. Minimal linear density at the left lung base. Otherwise, clear lungs. Thoracic and upper lumbar spine degenerative changes and mild scoliosis. Lumbar spine fixation hardware. Marked right shoulder degenerative changes. IMPRESSION: 1. No acute abnormality. 2. Minimal linear atelectasis or scarring at the left lung base. 3. Diffusely enlarged and tortuous thoracic aorta and probable enlargement and tortuosity of the innominate artery. The size of the aortic could be better determined with a chest CT with contrast. 4. Marked right shoulder degenerative changes. Electronically Signed   By: Beckie Salts M.D.   On: 11/30/2015 14:42    EKG: Independently reviewed. Sinus tachycardia, prolong PR.  Assessment/Plan Active Problems:   GI bleed   AKI (acute kidney injury) (HCC)   Acute blood loss anemia   Hypokalemia  1-Acute blood loss anemia; in setting of GI bleed. GI bleed.  Admit to step down unit.  IV fluids  Patient to received 2 units of PRBC.  Cycle hb.  IV Protonix.  GI consulted, planning endoscopy 7-12.  2-Chronic  pain; continue with MS contin. Holder parameters for hypotension.   3-HTN; he relates that he has not been taking Medications for BP.   4-AKI; suspect hemodynamic related. IV fluids. Repeat labs in am.   5-Hypokalemia; mild replete IV  DVT prophylaxis: SCD, no anticoagulation in setting GI bleed.  Code Status: full code.  Family Communication: care discussed with patient.  Disposition Plan: home in 2 to 3 days depending on clinical condition   Consults called: GI Admission status: inpatient, step down unit    Hartley Barefootegalado, Auren Valdes A MD [facility] Pager [phone]  If 7PM-7AM, please contact night-coverage [email or web address] Password [email or web address]  11/30/2015, 3:07 PM

## 2015-11-30 NOTE — ED Notes (Signed)
Hospitalist at bedside 

## 2015-11-30 NOTE — ED Notes (Signed)
Patient being sent from [facility], Hgb 6.

## 2015-11-30 NOTE — ED Notes (Signed)
PA at bedside.

## 2015-11-30 NOTE — ED Notes (Signed)
Fecal occult card placed at bedside for provider.

## 2015-11-30 NOTE — ED Notes (Signed)
Patient went to PCP earlier today following 2-3 days of increasing weakness.  Patient's Hgb found to be 6.  Patient denies pain, but has hx of ulcer.  Patient states he has been passing dark/tarry blood in his stool for several days.

## 2015-11-30 NOTE — Consult Note (Signed)
[facility] Consult: 3:17 PM 11/30/2015  LOS: 0 days    Referring Provider: Dr Philis Nettle. Reese in ED.    Primary Care Physician:  Kaleen Mask, MD Primary Gastroenterologist:  unassigned   Reason for Consultation:  Dark stools, weakness, hgb 6.3.     HPI: [name] is a 70 y.o. male.  Hx HTN.  CAD and 2003 coronary stenting (single Zeta stent to tandem RCA lesions, Dr Peter [place]).  Not on Plavix (used only for 30 days post stenting in 2003).  Chronic spine and joint pain s/p 2 right ankle surgeries and 4 lower back surgeries (1978, [id number], 2004). Remote colonoscopy "40 years ago" of which he recalls no details.  Took Nexium years ago but no longer and has no significant reflux sxs.  Pt generally sedentary.  Smokes 1/2 PPD.  Takes BID MS Contin (not on his med list) for joint/spine pain and MDs at the [place] are slowly weaning him off this.    ~ July 1, so about 10 days ago, pt with onset of black, loose stools up to 5 per day, along with early satiety, weakness, DOE.  Syncopal spell on July 1, none since.  Some epig/RUQ discomfort but no N/V for which an RN friend said it could be an ulcer (not sure she was aware of the change in stools).  At her suggestion he stopped daily 81 ASA but continued 1 x daily Voltaren.  Had routine OV with PMD this AM.  With the hx, MD sent pt to ED.  Hgb is 6.3.  MCV, platelets are wnl.  BUN and creat both elevated.   Transfusions and IV Protonix ordered.  No ETOH use since his teens/20s.  No hx liver or blood clotting issues     Past Medical History  Diagnosis Date  . Coronary artery disease   . Hypertension   . Degenerative arthritis of spine   . Right foot drop     Past Surgical History  Procedure Laterality Date  . Cholecystectomy open    . Coronary stent  placement    . Back surgery    . Foot surgery      Prior to Admission medications   Medication Sig Start Date End Date Taking? Authorizing Provider  amLODipine (NORVASC) 10 MG tablet Take 10 mg by mouth daily.    Historical Provider, MD  metoprolol succinate (TOPROL-XL) 50 MG 24 hr tablet Take 50 mg by mouth daily. Take with or immediately following a meal.    Historical Provider, MD  nitroGLYCERIN (NITROSTAT) 0.4 MG SL tablet Place 0.4 mg under the tongue every 5 (five) minutes as needed.    Historical Provider, MD    Scheduled Meds: . [START ON 12/01/2015] pantoprazole  40 mg Intravenous Q12H   Infusions: . sodium chloride    . pantoprazole (PROTONIX) IVPB 80 mg (11/30/15 1512)   PRN Meds:    Allergies as of 11/30/2015  . (No Known Allergies)    Family History  Problem Relation Age of Onset  . Heart attack    .  Coronary artery disease      Social History   Social History  . Marital Status: Married    Spouse Name: N/A  . Number of Children: 1  . Years of Education: N/A   Occupational History  . Not on file.   Social History Main Topics  . Smoking status: Current Every Day Smoker -- 1.00 packs/day  . Smokeless tobacco: Not on file  . Alcohol Use: No  . Drug Use: Not on file  . Sexual Activity: Not on file   Other Topics Concern  . Not on file   Social History Narrative    REVIEW OF SYSTEMS: Constitutional:  Weakness.  Weight stable ENT:  No nose bleeds Pulm:  DOE is new since dark stools began.  No cough CV:  No palpitations, no LE edema.  GU:  No hematuria, no frequency.   GI:  No dysphagia.  No heartburn.   Heme:  No unusual bleeding does bruise easily   Transfusions:  None per record or his recall Neuro:  No headaches, no peripheral tingling or numbness Derm:  Gets pruritic skin eruptions on arms.  He says this is from the MS contin.    Endocrine:  No sweats or chills.  No polyuria or dysuria Immunization:  Not queried.  Travel:  None beyond  local counties in last few months.    PHYSICAL EXAM: Vital signs in last 24 hours: Filed Vitals:   11/30/15 1500 11/30/15 1513  BP: 110/66   Pulse: 91 94  Temp:    Resp: 13 13   Wt Readings from Last 3 Encounters:  No data found for Wt    General: pleasant, looks pale and tired but not acutely ill Head:  No asymmetry or swelling  Eyes:  No icterus or pallor.  EOMI Ears:  Not HOH  Nose:  No discharge or con Mouth:  Clear, moist, full dentures not removed.  Neck:  No mass, no JVD, no TMG Lungs:  Clear bil.  No cough or SOB Heart: RRR.  No MRG.  S1/s2 present.   Abdomen:  Soft, NT, active BS.  No mass, no HSM.  No bruits, no hernias.   Rectal: deferred.     Musc/Skeltl: markedly deformed right ankle/foot with callous along lateral foot Extremities:  No CCE  Neurologic:  Oriented x 3.  No tremor.  Moves all 4s, strength not tested.   Skin:  No rash, no sores.   Tattoos:  None seen   Psych:  Pleasant, cooperative.    Intake/Output from previous day:   Intake/Output this shift:    LAB RESULTS:  Recent Labs  11/30/15 1358  WBC 11.3*  HGB 6.3*  HCT 19.9*  PLT 460*   BMET Lab Results  Component Value Date   NA 139 11/30/2015   K 3.3* 11/30/2015   CL 107 11/30/2015   CO2 23 11/30/2015   GLUCOSE 121* 11/30/2015   BUN 35* 11/30/2015   CREATININE 1.68* 11/30/2015   CALCIUM 8.8* 11/30/2015   LFT  Recent Labs  11/30/15 1407  PROT 6.2*  ALBUMIN 3.6  AST 16  ALT 19  ALKPHOS 68  BILITOT 0.5  BILIDIR <0.1*  IBILI NOT CALCULATED   PT/INR No results found for: INR, PROTIME Hepatitis Panel No results for input(s): HEPBSAG, HCVAB, HEPAIGM, HEPBIGM in the last 72 hours. C-Diff No components found for: CDIFF Lipase  No results found for: LIPASE  Drugs of Abuse  No results found for: LABOPIA, COCAINSCRNUR, LABBENZ, AMPHETMU, THCU, LABBARB  RADIOLOGY STUDIES: Dg Chest 2 View  11/30/2015  CLINICAL DATA:  Shortness of breath.  Dizziness. EXAM: CHEST  2  VIEW COMPARISON:  Report dated 08/25/2002. FINDINGS: The cardiac silhouette is near the upper limit of normal in size. Tortuous and enlarged thoracic aorta. There is also evidence of innominate artery enlargement and tortuosity. Minimal linear density at the left lung base. Otherwise, clear lungs. Thoracic and upper lumbar spine degenerative changes and mild scoliosis. Lumbar spine fixation hardware. Marked right shoulder degenerative changes. IMPRESSION: 1. No acute abnormality. 2. Minimal linear atelectasis or scarring at the left lung base. 3. Diffusely enlarged and tortuous thoracic aorta and probable enlargement and tortuosity of the innominate artery. The size of the aortic could be better determined with a chest CT with contrast. 4. Marked right shoulder degenerative changes. Electronically Signed   By: Beckie SaltsSteven  Reid M.D.   On: 11/30/2015 14:42    ENDOSCOPIC STUDIES: Per HPI.    IMPRESSION:   *  Upper GI bleed.  Suspect ulcers from Voltaren use.    *  Blood loss anemia.  Normcytic  *  Chronic spine and joint pain.  S/p 4 spine, 2 right ankle surgeries.  Uses long acting narcotics in addition to Voltaren at home.      PLAN:     *  Transfuse, 2 PRBCs ordered.  Follow up Hgb.  Voltaren not continued.   Arrangements made for EGD tomorrow at 3 PM.  Clears ok for today.  NPO post 5 AM.    Jennye MoccasinSarah Riannon Moran  11/30/2015, 3:17 PM Pager: [phone]

## 2015-11-30 NOTE — ED Notes (Signed)
Blood consent signed by patient and witnessed. Signed form placed at bedside.

## 2015-11-30 NOTE — ED Provider Notes (Signed)
CSN: [id number]     Arrival date & time 11/30/15  1305 History   First MD Initiated Contact with Patient 11/30/15 1352     Chief Complaint  Patient presents with  . Rectal Bleeding     Patient is a 69 y.o. male presenting with hematochezia. The history is provided by the patient and a relative. No language interpreter was used.  Rectal Bleeding  [name] is a 70 y.o. male who presents to the Emergency Department complaining of low hemoglobin.  He is referred to the emergency department by his primary care provider for evaluation of a low hemoglobin. He's been having black stools since Fourth of July weekend. He initially had melanotic and bloody stool and fell to the ground. Since that time he has had persistent black stools approximately 3-5 daily and progressive generalized weakness and dyspnea on exertion. His wife reports he's been complaining of intermittent epigastric pain and early saiety. No fevers, vomiting, nausea. No prior history of GI bleed. Symptoms are moderate, constant, worsening. His PCP saw him in the office this morning and labs there demonstrating hemoglobin of 6.  Past Medical History  Diagnosis Date  . Coronary artery disease   . Hypertension   . Degenerative arthritis of spine   . Right foot drop    Past Surgical History  Procedure Laterality Date  . Cholecystectomy open    . Coronary stent placement    . Back surgery    . Foot surgery     Family History  Problem Relation Age of Onset  . Heart attack    . Coronary artery disease     Social History  Substance Use Topics  . Smoking status: Current Every Day Smoker -- 1.00 packs/day  . Smokeless tobacco: None  . Alcohol Use: No    Review of Systems  Gastrointestinal: Positive for hematochezia.  All other systems reviewed and are negative.     Allergies  Review of patient's allergies indicates no known allergies.  Home Medications   Prior to Admission medications   Medication Sig Start Date  End Date Taking? Authorizing Provider  amLODipine (NORVASC) 10 MG tablet Take 10 mg by mouth daily.   Yes Historical Provider, MD  metoprolol succinate (TOPROL-XL) 50 MG 24 hr tablet Take 50 mg by mouth daily. Take with or immediately following a meal.   Yes Historical Provider, MD  Multiple Vitamin (MULTIVITAMIN WITH MINERALS) TABS tablet Take 1 tablet by mouth daily.   Yes Historical Provider, MD  nitroGLYCERIN (NITROSTAT) 0.4 MG SL tablet Place 0.4 mg under the tongue every 5 (five) minutes as needed for chest pain.    Yes Historical Provider, MD   BP 119/71 mmHg  Pulse 106  Temp(Src) 98.1 F (36.7 C) (Oral)  Resp 19  SpO2 100% Physical Exam  Constitutional: He is oriented to person, place, and time. He appears well-developed and well-nourished.  HENT:  Head: Normocephalic and atraumatic.  Cardiovascular: Normal rate and regular rhythm.   No murmur heard. Pulmonary/Chest: Effort normal and breath sounds normal. No respiratory distress.  Abdominal: Soft. There is no tenderness. There is no rebound and no guarding.  Genitourinary:  Brown stool with black speckles.  Musculoskeletal: He exhibits no edema or tenderness.  Neurological: He is alert and oriented to person, place, and time.  Skin: Skin is warm and dry.  Psychiatric: He has a normal mood and affect. His behavior is normal.  Nursing note and vitals reviewed.   ED Course  Procedures  CRITICAL CARE Performed by: Tilden Fossa   Total critical care time: 35 minutes  Critical care time was exclusive of separately billable procedures and treating other patients.  Critical care was necessary to treat or prevent imminent or life-threatening deterioration.  Critical care was time spent personally by me on the following activities: development of treatment plan with patient and/or surrogate as well as nursing, discussions with consultants, evaluation of patient's response to treatment, examination of patient, obtaining  history from patient or surrogate, ordering and performing treatments and interventions, ordering and review of laboratory studies, ordering and review of radiographic studies, pulse oximetry and re-evaluation of patient's condition.  Labs Review Labs Reviewed  CBC - Abnormal; Notable for the following:    WBC 11.3 (*)    RBC 2.11 (*)    Hemoglobin 6.3 (*)    HCT 19.9 (*)    RDW 17.0 (*)    Platelets 460 (*)    All other components within normal limits  BASIC METABOLIC PANEL - Abnormal; Notable for the following:    Potassium 3.3 (*)    Glucose, Bld 121 (*)    BUN 35 (*)    Creatinine, Ser 1.68 (*)    Calcium 8.8 (*)    GFR calc non Af Amer 39 (*)    GFR calc Af Amer 46 (*)    All other components within normal limits  HEPATIC FUNCTION PANEL - Abnormal; Notable for the following:    Total Protein 6.2 (*)    Bilirubin, Direct <0.1 (*)    All other components within normal limits  POC OCCULT BLOOD, ED - Abnormal; Notable for the following:    Fecal Occult Bld POSITIVE (*)    All other components within normal limits  POC OCCULT BLOOD, ED - Abnormal; Notable for the following:    Fecal Occult Bld POSITIVE (*)    All other components within normal limits  HEMOGLOBIN AND HEMATOCRIT, BLOOD  HEMOGLOBIN AND HEMATOCRIT, BLOOD  TYPE AND SCREEN  PREPARE RBC (CROSSMATCH)  ABO/RH    Imaging Review Dg Chest 2 View  11/30/2015  CLINICAL DATA:  Shortness of breath.  Dizziness. EXAM: CHEST  2 VIEW COMPARISON:  Report dated 08/25/2002. FINDINGS: The cardiac silhouette is near the upper limit of normal in size. Tortuous and enlarged thoracic aorta. There is also evidence of innominate artery enlargement and tortuosity. Minimal linear density at the left lung base. Otherwise, clear lungs. Thoracic and upper lumbar spine degenerative changes and mild scoliosis. Lumbar spine fixation hardware. Marked right shoulder degenerative changes. IMPRESSION: 1. No acute abnormality. 2. Minimal linear  atelectasis or scarring at the left lung base. 3. Diffusely enlarged and tortuous thoracic aorta and probable enlargement and tortuosity of the innominate artery. The size of the aortic could be better determined with a chest CT with contrast. 4. Marked right shoulder degenerative changes. Electronically Signed   By: Beckie Salts M.D.   On: 11/30/2015 14:42   I have personally reviewed and evaluated these images and lab results as part of my medical decision-making.   EKG Interpretation None      MDM   Final diagnoses:  None    Pt here for evaluation of weakness, black stools.  Hgb 6 today with heme positive stool, marginal BP.  Will transfuse 2 units pRBC for presumed upper GI bleed. Discussed with Lianne Bushy with GI who will see the patient in consult. Hospitalist was consulted for admission for further treatment. He was started on BID Protonix for presumed  upper GI bleed.  Pt updated of findings of studies and is in agreement with admission for further treatment.      Tilden FossaElizabeth Devyne Hauger, MD 11/30/15 1539

## 2015-11-30 NOTE — ED Notes (Signed)
RN remained with patient for the first 15 minutes of blood administration

## 2015-12-01 ENCOUNTER — Inpatient Hospital Stay (HOSPITAL_COMMUNITY): Payer: Medicare Other | Admitting: Anesthesiology

## 2015-12-01 ENCOUNTER — Encounter (HOSPITAL_COMMUNITY)
Admission: EM | Disposition: A | Discharge status: Home / Self Care | Payer: Self-pay | Source: Home / Self Care | Attending: Internal Medicine

## 2015-12-01 ENCOUNTER — Encounter (HOSPITAL_COMMUNITY): Payer: Self-pay

## 2015-12-01 DIAGNOSIS — K264 Chronic or unspecified duodenal ulcer with hemorrhage: Secondary | ICD-10-CM | POA: Diagnosis present

## 2015-12-01 HISTORY — PX: ESOPHAGOGASTRODUODENOSCOPY: SHX5428

## 2015-12-01 LAB — BASIC METABOLIC PANEL
ANION GAP: 6 (ref 5–15)
BUN: 26 mg/dL — ABNORMAL HIGH (ref 6–20)
CALCIUM: 7.8 mg/dL — AB (ref 8.9–10.3)
CO2: 23 mmol/L (ref 22–32)
Chloride: 109 mmol/L (ref 101–111)
Creatinine, Ser: 1.07 mg/dL (ref 0.61–1.24)
Glucose, Bld: 100 mg/dL — ABNORMAL HIGH (ref 65–99)
POTASSIUM: 3.5 mmol/L (ref 3.5–5.1)
Sodium: 138 mmol/L (ref 135–145)

## 2015-12-01 LAB — HEMOGLOBIN AND HEMATOCRIT, BLOOD
HCT: 23 % — ABNORMAL LOW (ref 39.0–52.0)
HEMATOCRIT: 29.5 % — AB (ref 39.0–52.0)
HEMOGLOBIN: 7.3 g/dL — AB (ref 13.0–17.0)
Hemoglobin: 9.3 g/dL — ABNORMAL LOW (ref 13.0–17.0)

## 2015-12-01 LAB — PREPARE RBC (CROSSMATCH)

## 2015-12-01 SURGERY — EGD (ESOPHAGOGASTRODUODENOSCOPY)
Anesthesia: Monitor Anesthesia Care

## 2015-12-01 MED ORDER — SODIUM CHLORIDE 0.9 % IV SOLN
Freq: Once | INTRAVENOUS | Status: AC
  Administered 2015-12-01: 10:00:00 via INTRAVENOUS

## 2015-12-01 MED ORDER — PROPOFOL 500 MG/50ML IV EMUL
INTRAVENOUS | Status: DC | PRN
  Administered 2015-12-01: 100 ug/kg/min via INTRAVENOUS

## 2015-12-01 MED ORDER — PROPOFOL 500 MG/50ML IV EMUL
INTRAVENOUS | Status: DC | PRN
  Administered 2015-12-01 (×2): 10 mg via INTRAVENOUS
  Administered 2015-12-01: 30 mg via INTRAVENOUS

## 2015-12-01 MED ORDER — PANTOPRAZOLE SODIUM 40 MG PO TBEC
40.0000 mg | DELAYED_RELEASE_TABLET | Freq: Two times a day (BID) | ORAL | Status: DC
  Administered 2015-12-01 – 2015-12-02 (×2): 40 mg via ORAL
  Filled 2015-12-01 (×5): qty 1

## 2015-12-01 MED ORDER — SODIUM CHLORIDE 0.9 % IV SOLN: INTRAVENOUS | Status: DC

## 2015-12-01 NOTE — Transfer of Care (Signed)
Immediate Anesthesia Transfer of Care Note  Patient: [name]  Procedure(s) Performed: Procedure(s): ESOPHAGOGASTRODUODENOSCOPY (EGD) (N/A)  Patient Location: PACU  Anesthesia Type:MAC  Level of Consciousness: awake, alert  and oriented  Airway & Oxygen Therapy: Patient Spontanous Breathing and Patient connected to nasal cannula oxygen  Post-op Assessment: Report given to RN and Post -op Vital signs reviewed and stable  Post vital signs: Reviewed and stable  Last Vitals:  Filed Vitals:   12/01/15 1005 12/01/15 1154  BP: 135/78 143/59  Pulse: 59 60  Temp: 36.8 C 36.8 C  Resp: 13 14    Last Pain:  Filed Vitals:   12/01/15 1157  PainSc: 0-No pain         Complications: No apparent anesthesia complications

## 2015-12-01 NOTE — Anesthesia Postprocedure Evaluation (Signed)
Anesthesia Post Note  Patient: [name]  Procedure(s) Performed: Procedure(s) (LRB): ESOPHAGOGASTRODUODENOSCOPY (EGD) (N/A)  Patient location during evaluation: Endoscopy Anesthesia Type: MAC Level of consciousness: awake and alert Pain management: pain level controlled Vital Signs Assessment: post-procedure vital signs reviewed and stable Respiratory status: spontaneous breathing, nonlabored ventilation, respiratory function stable and patient connected to nasal cannula oxygen Cardiovascular status: stable and blood pressure returned to baseline Anesthetic complications: no    Last Vitals:  Filed Vitals:   12/01/15 1310 12/01/15 1315  BP: 126/62   Pulse: 59 122  Temp:    Resp: 18 19    Last Pain:  Filed Vitals:   12/01/15 1317  PainSc: 0-No pain                 Phillips Groutarignan, Iyona Pehrson

## 2015-12-01 NOTE — Progress Notes (Signed)
Received from SDU, alert and oriented, agree with previous RN's assessment. 

## 2015-12-01 NOTE — Op Note (Signed)
[facility] Patient Name: [name] Procedure Date: 12/01/2015 MRN: [id number] Attending MD: Starr Lake. Myrtie Neither , MD Date of Birth: 1945/06/11 CSN: [id number] Age: 70 Admit Type: Inpatient Procedure:                Upper GI endoscopy Indications:              Acute post hemorrhagic anemia, Melena Providers:                Sherilyn Cooter L. Myrtie Neither, MD, Dwain Sarna, RN, Kandice Robinsons, Technician Referring MD:              Medicines:                Monitored Anesthesia Care Complications:            No immediate complications. Estimated Blood Loss:     Estimated blood loss was minimal. Procedure:                Pre-Anesthesia Assessment:                           - Prior to the procedure, a History and Physical                            was performed, and patient medications and                            allergies were reviewed. The patient's tolerance of                            previous anesthesia was also reviewed. The risks                            and benefits of the procedure and the sedation                            options and risks were discussed with the patient.                            All questions were answered, and informed consent                            was obtained. Prior Anticoagulants: The patient has                            taken no previous anticoagulant or antiplatelet                            agents. ASA Grade Assessment: III - A patient with                            severe systemic disease. After reviewing the risks  and benefits, the patient was deemed in                            satisfactory condition to undergo the procedure.                           After obtaining informed consent, the endoscope was                            passed under direct vision. Throughout the                            procedure, the patient's blood pressure, pulse, and   oxygen saturations were monitored continuously. The                            [id number]) scope was introduced through the                            mouth, and advanced to the second part of duodenum.                            The upper GI endoscopy was accomplished without                            difficulty. The patient tolerated the procedure                            well. Scope In: Scope Out: Findings:      2 islands of salmon-colored mucosa were present at 38 cm. No other       visible abnormalities were present. The maximum longitudinal extent of       these esophageal mucosal changes was 1 cm in length. Biopsies were taken       with a cold forceps for histology.      Mild inflammation characterized by erythema was found in the gastric       antrum. Biopsies were taken with a cold forceps for histology.      One non-bleeding cratered duodenal ulcer with no stigmata of bleeding       was found at the junction of the duodenal bulb and sweep. The ulcer was       difficult to completely visualize due to edema and location. It was also       causing a mild stricture which was crossed with gentle scope pressure.      The cardia and gastric fundus were normal on retroflexion.      There was no fresh or old blood in the UGI tract. Impression:               - Salmon-colored mucosa. Biopsied to exclude                            Barrett's esophagus.                           - Gastritis. Biopsied.                           -  One non-bleeding duodenal ulcer with no stigmata                            of bleeding. Moderate Sedation:      MAC sedation used Recommendation:           - Return patient to hospital ward for ongoing care.                           - Resume regular diet.                           - Continue present medications.Protonix was changed                            to oral dosing.                           - Await pathology results.                            If no overt rebleeding and hemoglobin stable                            tomorrow morning. Patient may be discharged home. Procedure Code(s):        --- Professional ---                           [phone] [id number], Esophagogastroduodenoscopy, flexible,                            transoral; with biopsy, single or multiple Diagnosis Code(s):        --- Professional ---                           K22.8, Other specified diseases of esophagus                           K29.70, Gastritis, unspecified, without bleeding                           K26.9, Duodenal ulcer, unspecified as acute or                            chronic, without hemorrhage or perforation                           D62, Acute posthemorrhagic anemia                           K92.1, Melena (includes Hematochezia) CPT copyright 2016 American Medical Association. All rights reserved. The codes documented in this report are preliminary and upon coder review may  be revised to meet current compliance requirements. Henry L. Myrtie Neitheranis, MD 12/01/2015 1:09:15 PM This report has been signed electronically. Number of Addenda: 0

## 2015-12-01 NOTE — Anesthesia Preprocedure Evaluation (Addendum)
Anesthesia Evaluation  Patient identified by MRN, date of birth, ID band Patient awake    Reviewed: Allergy & Precautions, NPO status , Patient's Chart, lab work & pertinent test results  Airway Mallampati: II  TM Distance: >3 FB Neck ROM: Full    Dental no notable dental hx. (+) Upper Dentures, Partial Lower   Pulmonary Current Smoker,    Pulmonary exam normal breath sounds clear to auscultation       Cardiovascular hypertension, Pt. on medications + CAD and + Cardiac Stents (2003)  Normal cardiovascular exam Rhythm:Regular Rate:Normal     Neuro/Psych negative neurological ROS  negative psych ROS   GI/Hepatic negative GI ROS, Neg liver ROS,   Endo/Other  negative endocrine ROS  Renal/GU negative Renal ROS  negative genitourinary   Musculoskeletal negative musculoskeletal ROS (+)   Abdominal   Peds negative pediatric ROS (+)  Hematology negative hematology ROS (+) anemia , Hgb7.3   Anesthesia Other Findings Chronic pain on MScontin  Reproductive/Obstetrics negative OB ROS                          Anesthesia Physical Anesthesia Plan  ASA: III  Anesthesia Plan: MAC   Post-op Pain Management:    Induction:   Airway Management Planned: Simple Face Mask and Nasal Cannula  Additional Equipment:   Intra-op Plan:   Post-operative Plan:   Informed Consent: I have reviewed the patients History and Physical, chart, labs and discussed the procedure including the risks, benefits and alternatives for the proposed anesthesia with the patient or authorized representative who has indicated his/her understanding and acceptance.   Dental advisory given  Plan Discussed with: CRNA  Anesthesia Plan Comments:         Anesthesia Quick Evaluation

## 2015-12-01 NOTE — Progress Notes (Signed)
PROGRESS NOTE                                                                                                                                                                                                             Patient Demographics:    [name], is a 70 y.o. male, DOB - 1945-09-18, ZOX:[id number]  Admit date - 11/30/2015   Admitting Physician Alba Cory, MD  Outpatient Primary MD for the patient is Kaleen Mask, MD  LOS - 1  Outpatient Specialists: None  Chief Complaint  Patient presents with  . Rectal Bleeding       Brief Narrative   70 y.o. male with medical history significant of HTN, Chronic pain on morphine, takes occasionally ibuprofen, who presents to ED refer by his PCP for evaluation of melena and low hb. Patient reports one-week history of aggressive weakness with dark stools. He also had a syncopal episode without any injury. Denied chest pain, shortness of breath, abdominal pain, hematemesis. In the ED vitals were stable, hemoglobin was 6.3, potassium of 3.3 and creatinine 1.60. Normal WBC, LFTs and platelets. Hemoccult-positive. Chest echo was unremarkable. Patient admitted to stepdown unit ordered for 2 units PRBC. [facility] consulted and underwent EGD showing gastritis which was biopsied, one nonbleeding duodenal ulcer without bleeding stigmata and a salmon-colored esophageal mucosa which was biopsied to exclude Barrett's esophagus.     Subjective:   Reports feeling better. No hematemesis or abdominal pain.   Assessment  & Plan :   Principal Problems: Acute upper GI bleed Possibly secondary to duodenal ulcer/gastritis. Hemoglobin improved 7.3 after 2 unit PRBC.  Switch to oral Protonix. Monitor H&H in a.m. and if stable can be discharged home. Instructed on avoiding NSAIDs. Follow gastric biopsy as outpt    AKI (acute kidney injury) (HCC) Secondary to dehydration.  Resolved with IV fluids.    Acute blood loss anemia Secondary to upper GI bleed. Transfuse 2 units with some improvement. Monitoring a.m.    Hypokalemia Replenished   Chronic back pain Continue home dose morphine      Code Status : Full code  Family Communication  : None at bedside  Disposition Plan  : Transfer to telemetry. Home in a.m. if H&H stable.   Barriers For Discharge : Further observation overnight  Consults  :  [facility]  Procedures  :  EGD  DVT Prophylaxis  :  SCDs  Lab Results  Component Value Date   PLT 460* 11/30/2015    Antibiotics  :   Anti-infectives    None        Objective:   Filed Vitals:   12/01/15 1154 12/01/15 1308 12/01/15 1310 12/01/15 1315  BP: 143/59 108/67 126/62   Pulse: 60 75 59 122  Temp: 98.2 F (36.8 C) 97.6 F (36.4 C)    TempSrc: Oral Oral    Resp: Height:  (1.702 m)     Weight: 68 kg (149 lb 14.6 oz)     SpO2: 100% 100% 100% 90%    Wt Readings from Last 3 Encounters:  12/01/15 68 kg (149 lb 14.6 oz)     Intake/Output Summary (Last 24 hours) at 12/01/15 1459 Last data filed at 12/01/15 1329  Gross per 24 hour  Intake   2927 ml  Output    880 ml  Net   2047 ml     Physical Exam  Gen: not in distress HEENT: Pallor present,moist mucosa, supple neck Chest: clear b/l, no added sounds CVS: N S1&S2, no murmurs, rubs or gallop GI: soft, NT, ND, BS+ Musculoskeletal: warm, no edema CNS: AAOX3, non focal    Data Review:    CBC  Recent Labs Lab 11/30/15 1358 11/30/15 1929 12/01/15 0309  WBC 11.3*  --   --   HGB 6.3* 6.4* 7.3*  HCT 19.9* 20.4* 23.0*  PLT 460*  --   --   MCV 94.3  --   --   MCH 29.9  --   --   MCHC 31.7  --   --   RDW 17.0*  --   --     Chemistries   Recent Labs Lab 11/30/15 1358 11/30/15 1407 12/01/15 0309  NA 139  --  138  K 3.3*  --  3.5  CL 107  --  109  CO2 23  --  23  GLUCOSE 121*  --  100*  BUN 35*  --  26*  CREATININE 1.68*  --  1.07   CALCIUM 8.8*  --  7.8*  AST  --  16  --   ALT  --  19  --   ALKPHOS  --  68  --   BILITOT  --  0.5  --    ------------------------------------------------------------------------------------------------------------------ No results for input(s): CHOL, HDL, LDLCALC, TRIG, CHOLHDL, LDLDIRECT in the last 72 hours.  No results found for: HGBA1C ------------------------------------------------------------------------------------------------------------------ No results for input(s): TSH, T4TOTAL, T3FREE, THYROIDAB in the last 72 hours.  Invalid input(s): FREET3 ------------------------------------------------------------------------------------------------------------------ No results for input(s): VITAMINB12, FOLATE, FERRITIN, TIBC, IRON, RETICCTPCT in the last 72 hours.  Coagulation profile No results for input(s): INR, PROTIME in the last 168 hours.  No results for input(s): DDIMER in the last 72 hours.  Cardiac Enzymes No results for input(s): CKMB, TROPONINI, MYOGLOBIN in the last 168 hours.  Invalid input(s): CK ------------------------------------------------------------------------------------------------------------------ No results found for: BNP  Inpatient Medications  Scheduled Meds: . morphine  15 mg Oral Q12H  . multivitamin with minerals  1 tablet Oral Daily  . pantoprazole  40 mg Oral BID   Continuous Infusions:  PRN Meds:.acetaminophen **OR** acetaminophen, morphine injection, ondansetron **OR** ondansetron (ZOFRAN) IV, oxyCODONE  Micro Results Recent Results (from the past 240 hour(s))  MRSA PCR Screening     Status: None   Collection Time: 11/30/15  5:00 PM  Result Value Ref Range  Status   MRSA by PCR NEGATIVE NEGATIVE Final    Comment:        The GeneXpert MRSA Assay (FDA approved for NASAL specimens only), is one component of a comprehensive MRSA colonization surveillance program. It is not intended to diagnose MRSA infection nor to guide  or monitor treatment for MRSA infections.     Radiology Reports Dg Chest 2 View  11/30/2015  CLINICAL DATA:  Shortness of breath.  Dizziness. EXAM: CHEST  2 VIEW COMPARISON:  Report dated 08/25/2002. FINDINGS: The cardiac silhouette is near the upper limit of normal in size. Tortuous and enlarged thoracic aorta. There is also evidence of innominate artery enlargement and tortuosity. Minimal linear density at the left lung base. Otherwise, clear lungs. Thoracic and upper lumbar spine degenerative changes and mild scoliosis. Lumbar spine fixation hardware. Marked right shoulder degenerative changes. IMPRESSION: 1. No acute abnormality. 2. Minimal linear atelectasis or scarring at the left lung base. 3. Diffusely enlarged and tortuous thoracic aorta and probable enlargement and tortuosity of the innominate artery. The size of the aortic could be better determined with a chest CT with contrast. 4. Marked right shoulder degenerative changes. Electronically Signed   By: Beckie SaltsSteven  Reid M.D.   On: 11/30/2015 14:42    Time Spent in minutes  25   Eddie NorthHUNGEL, Bambie Pizzolato M.D on 12/01/2015 at 2:59 PM  Between 7am to 7pm - Pager - [phone]  After 7pm go to [email or web address] - password [email or web address]  [facility] -  Office  [phone]

## 2015-12-02 DIAGNOSIS — K264 Chronic or unspecified duodenal ulcer with hemorrhage: Principal | ICD-10-CM

## 2015-12-02 DIAGNOSIS — K922 Gastrointestinal hemorrhage, unspecified: Secondary | ICD-10-CM

## 2015-12-02 DIAGNOSIS — M545 Low back pain: Secondary | ICD-10-CM

## 2015-12-02 DIAGNOSIS — G8929 Other chronic pain: Secondary | ICD-10-CM

## 2015-12-02 LAB — TYPE AND SCREEN
ABO/RH(D): A POS
ANTIBODY SCREEN: NEGATIVE
UNIT DIVISION: 0
Unit division: 0
Unit division: 0

## 2015-12-02 LAB — CBC
HCT: 28.3 % — ABNORMAL LOW (ref 39.0–52.0)
Hemoglobin: 9.2 g/dL — ABNORMAL LOW (ref 13.0–17.0)
MCH: 27.5 pg (ref 26.0–34.0)
MCHC: 32.5 g/dL (ref 30.0–36.0)
MCV: 84.5 fL (ref 78.0–100.0)
PLATELETS: 349 10*3/uL (ref 150–400)
RBC: 3.35 MIL/uL — ABNORMAL LOW (ref 4.22–5.81)
RDW: 22.2 % — AB (ref 11.5–15.5)
WBC: 9.7 10*3/uL (ref 4.0–10.5)

## 2015-12-02 LAB — IRON AND TIBC
Iron: 16 ug/dL — ABNORMAL LOW (ref 45–182)
Saturation Ratios: 6 % — ABNORMAL LOW (ref 17.9–39.5)
TIBC: 286 ug/dL (ref 250–450)
UIBC: 270 ug/dL

## 2015-12-02 MED ORDER — PANTOPRAZOLE SODIUM 40 MG PO TBEC: 40.0000 mg | DELAYED_RELEASE_TABLET | Freq: Two times a day (BID) | ORAL | Status: DC

## 2015-12-02 MED ORDER — MORPHINE SULFATE ER 15 MG PO TBCR: 15.0000 mg | EXTENDED_RELEASE_TABLET | Freq: Two times a day (BID) | ORAL | Status: DC

## 2015-12-02 NOTE — Discharge Instructions (Signed)
Gastrointestinal Bleeding °Gastrointestinal bleeding is bleeding somewhere along the path that food travels through the body (digestive tract). This path is anywhere between the mouth and the opening of the butt (anus). You may have blood in your throw up (vomit) or in your poop (stools). If there is a lot of bleeding, you may need to stay in the hospital. °HOME CARE °· Only take medicine as told by your doctor. °· Eat foods with fiber such as whole grains, fruits, and vegetables. You can also try eating 1 to 3 prunes a day. °· Drink enough fluids to keep your pee (urine) clear or pale yellow. °GET HELP RIGHT AWAY IF:  °· Your bleeding gets worse. °· You feel dizzy, weak, or you pass out (faint). °· You have bad cramps in your back or belly (abdomen). °· You have large blood clumps (clots) in your poop. °· Your problems are getting worse. °MAKE SURE YOU:  °· Understand these instructions. °· Will watch your condition. °· Will get help right away if you are not doing well or get worse. °  °This information is not intended to replace advice given to you by your health care provider. Make sure you discuss any questions you have with your health care provider. °  °Document Released: 02/15/2008 Document Revised: 04/24/2012 Document Reviewed: 10/26/2014 °Elsevier Interactive Patient Education ©2016 [employer] ° °

## 2015-12-02 NOTE — Progress Notes (Signed)
Progress Note   Subjective  [name] is a 70 y/o Caucasian male who was admitted on 11/30/15 for weakness, black stool and abnormal labs. Pt had EGD on 12/01/15 Which showed a salmon-colored mucosa which was biopsied to exclude Barrett's esophagus, gastritis and one nonbleeding duodenal ulcer with no stigmata of bleeding. Pathology is pending  Today, the patient is found laying in bed, he tells me that he has been being "lazy", and sleeping in this morning. He has just now ordered food to eat and it should be arriving "shortly". Patient denies any abdominal pain or further signs of GI bleeding including melena. He is aware that plans are for discharge today and he is agreeable.   Objective   Vital signs in last 24 hours: Temp:  [97.6 F (36.4 C)-99.5 F (37.5 C)] 98.3 F (36.8 C) (07/13 0518) Pulse Rate:  [59-122] 74 (07/13 0518) Resp:  [14-19] 16 (07/13 0518) BP: (108-143)/(59-70) 137/69 mmHg (07/13 0518) SpO2:  [90 %-100 %] 99 % (07/13 0518) Weight:  [149 lb 14.6 oz (68 kg)] 149 lb 14.6 oz (68 kg) (07/12 1154) Last BM Date: 12/02/15 General: Caucasian male in NAD Heart:  Regular rate and rhythm; no murmurs Lungs: Respirations even and unlabored, lungs CTA bilaterally Abdomen:  Soft, nontender and nondistended. Normal bowel sounds. Extremities:  Without edema. Neurologic:  Alert and oriented,  grossly normal neurologically. Psych:  Cooperative. Normal mood and affect.  Intake/Output from previous day: 07/12 0701 - 07/13 0700 In: 903.7 [I.V.:568.7; Blood:335] Out: 1450 [Urine:1450] Intake/Output this shift: Total I/O In: -  Out: 200 [Urine:200]  Lab Results:  Recent Labs  11/30/15 1358  12/01/15 0309 12/01/15 1935 12/02/15 0435  WBC 11.3*  --   --   --  9.7  HGB 6.3*  < > 7.3* 9.3* 9.2*  HCT 19.9*  < > 23.0* 29.5* 28.3*  PLT 460*  --   --   --  349  < > = values in this interval not displayed. BMET  Recent Labs  11/30/15 1358 12/01/15 0309  NA 139 138  K  3.3* 3.5  CL 107 109  CO2 23 23  GLUCOSE 121* 100*  BUN 35* 26*  CREATININE 1.68* 1.07  CALCIUM 8.8* 7.8*   LFT  Recent Labs  11/30/15 1407  PROT 6.2*  ALBUMIN 3.6  AST 16  ALT 19  ALKPHOS 68  BILITOT 0.5  BILIDIR <0.1*  IBILI NOT CALCULATED   PT/INR No results for input(s): LABPROT, INR in the last 72 hours.  Studies/Results: Dg Chest 2 View  11/30/2015  CLINICAL DATA:  Shortness of breath.  Dizziness. EXAM: CHEST  2 VIEW COMPARISON:  Report dated 08/25/2002. FINDINGS: The cardiac silhouette is near the upper limit of normal in size. Tortuous and enlarged thoracic aorta. There is also evidence of innominate artery enlargement and tortuosity. Minimal linear density at the left lung base. Otherwise, clear lungs. Thoracic and upper lumbar spine degenerative changes and mild scoliosis. Lumbar spine fixation hardware. Marked right shoulder degenerative changes. IMPRESSION: 1. No acute abnormality. 2. Minimal linear atelectasis or scarring at the left lung base. 3. Diffusely enlarged and tortuous thoracic aorta and probable enlargement and tortuosity of the innominate artery. The size of the aortic could be better determined with a chest CT with contrast. 4. Marked right shoulder degenerative changes. Electronically Signed   By: Beckie SaltsSteven  Reid M.D.   On: 11/30/2015 14:42   12/01/15 EGD, Dr. Myrtie Neitheranis: Impression: Salmon colored mucosa biopsied to exclude Barrett's  esophagus, gastritis and one nonbleeding duodenal ulcer with no stigmata of bleeding; pathology pending    Assessment / Plan:   Assessment: 1. Upper GI bleed: EGD yesterday showed one nonbleeding duodenal ulcer and gastritis, ulcer thought to have resulted from Voltaren use, hemoglobin stable today 2. Acute blood loss anemia: Due to above, hemoglobin stable 3. Chronic spine and joint pain  Plan: 1. Continue current medications 2. Advance diet as tolerated 3. Okay with discharge home today if patient remains stable this  morning 4. Will discuss above with Dr. Myrtie Neither, please await any further recommendations  Thank you for your kind consultation, we will sign off.  Active Problems:   GI bleed   AKI (acute kidney injury) (HCC)   Acute blood loss anemia   Hypokalemia   Duodenal ulcer hemorrhage     LOS: 2 days   Unk Lightning  12/02/2015, 10:38 AM  Pager # [phone]

## 2015-12-02 NOTE — Discharge Summary (Addendum)
Physician Discharge Summary  [name] ZOX:[id number] DOB: October 21, 1945 DOA: 11/30/2015  PCP: Kaleen Mask, MD  Admit date: 11/30/2015 Discharge date: 12/02/2015  Admitted From: Home Disposition:  Home  Recommendations for Outpatient Follow-up:  1. Follow up with PCP in 1-2 weeks. Please check hemoglobin getting outpatient follow-up and start him on oral iron supplement. 2. Follow-up with [facility] in 3-4 weeks. (Office will call for appointment)  Home Health: None Equipment/Devices: None  Discharge Condition: Stable CODE STATUS: Full code Diet recommendation: Regular    Discharge Diagnoses:  Principal Problem:   Acute upper GI bleed   Active Problems:   AKI (acute kidney injury) (HCC)   Acute blood loss anemia   Hypokalemia   Duodenal ulcer hemorrhage   Chronic low back pain   Essential hypertension  Brief narrative/history of present illness 70 y.o. male with medical history significant of HTN, Chronic pain on morphine, takes occasionally ibuprofen, who presents to ED refer by his PCP for evaluation of melena and low hb. Patient reports one-week history of aggressive weakness with dark stools. He also had a syncopal episode without any injury. Denied chest pain, shortness of breath, abdominal pain, hematemesis. In the ED vitals were stable, hemoglobin was 6.3, potassium of 3.3 and creatinine 1.60. Normal WBC, LFTs and platelets. Hemoccult-positive. Chest echo was unremarkable. Patient admitted to stepdown unit ordered for 2 units PRBC. [facility] consulted and underwent EGD showing gastritis which was biopsied, one nonbleeding duodenal ulcer without bleeding stigmata and a salmon-colored esophageal mucosa which was biopsied to exclude Barrett's esophagus.   Hospital course Acute upper GI bleed secondary to duodenal ulcer/gastritis. Hemoglobin improved to 9.3 after 2 units PRBC. Tolerating advanced diet. Will be discharged on twice a day PPI. Instructed on  avoiding NSAIDs. Follow gastric biopsy as outpt. Should follow-up with his PCP in 1 week and have his hemoglobin checked. Also should be started on iron supplement. GI will call patient with outpatient appointment.   AKI (acute kidney injury) (HCC) Secondary to dehydration. Resolved with IV fluids.   Acute blood loss anemia Secondary to upper GI bleed. Admit to unit PRBC.   Hypokalemia Replenished   Chronic back pain Continue home dose morphine  Essential hypertension Stable. Resume home medications     Family Communication : None at bedside  .     Consults : [facility]  Procedures : EGD     Discharge Instructions     Medication List    TAKE these medications        amLODipine 10 MG tablet  Commonly known as:  NORVASC  Take 10 mg by mouth daily.     metoprolol succinate 50 MG 24 hr tablet  Commonly known as:  TOPROL-XL  Take 50 mg by mouth daily. Take with or immediately following a meal.     morphine 15 MG 12 hr tablet  Commonly known as:  MS CONTIN  Take 1 tablet (15 mg total) by mouth every 12 (twelve) hours.     multivitamin with minerals Tabs tablet  Take 1 tablet by mouth daily.     nitroGLYCERIN 0.4 MG SL tablet  Commonly known as:  NITROSTAT  Place 0.4 mg under the tongue every 5 (five) minutes as needed for chest pain.     pantoprazole 40 MG tablet  Commonly known as:  PROTONIX  Take 1 tablet (40 mg total) by mouth 2 (two) times daily.           Follow-up Information  Follow up with Kaleen Mask, MD In 1 week.   Specialty:  Family Medicine   Contact information:   [address] [place] [address] [phone]       Follow up with Charlie Pitter III, MD. Schedule an appointment as soon as possible for a visit in 4 weeks.   Specialty:  Gastroenterology   Contact information:   [address] [place] [address] [phone]      No Known Allergies  Consultations:  [facility]   Procedures/Studies: Dg Chest 2 View  11/30/2015  CLINICAL DATA:  Shortness of breath.  Dizziness. EXAM: CHEST  2 VIEW COMPARISON:  Report dated 08/25/2002. FINDINGS: The cardiac silhouette is near the upper limit of normal in size. Tortuous and enlarged thoracic aorta. There is also evidence of innominate artery enlargement and tortuosity. Minimal linear density at the left lung base. Otherwise, clear lungs. Thoracic and upper lumbar spine degenerative changes and mild scoliosis. Lumbar spine fixation hardware. Marked right shoulder degenerative changes. IMPRESSION: 1. No acute abnormality. 2. Minimal linear atelectasis or scarring at the left lung base. 3. Diffusely enlarged and tortuous thoracic aorta and probable enlargement and tortuosity of the innominate artery. The size of the aortic could be better determined with a chest CT with contrast. 4. Marked right shoulder degenerative changes. Electronically Signed   By: Beckie Salts M.D.   On: 11/30/2015 14:42    EGD on 7/12 Salmon-colored mucosa. Biopsied to excludeBarrett's esophagus. - Gastritis. Biopsied.  - One non-bleeding duodenal ulcer with no stigmata  of bleeding.   Subjective:   Discharge Exam: Filed Vitals:   12/01/15 2016 12/02/15 0518  BP: 124/63 137/69  Pulse: 81 74  Temp: 98.4 F (36.9 C) 98.3 F (36.8 C)  Resp: 16 16   Filed Vitals:   12/01/15 1630 12/01/15 1813 12/01/15 2016 12/02/15 0518  BP: 132/63 134/70 124/63 137/69  Pulse: 80 76 81 74  Temp:  99.5 F (37.5 C) 98.4 F (36.9 C) 98.3 F (36.8 C)  TempSrc:  Oral Oral Oral  Resp: Height:      Weight:      SpO2: 100% 100% 100% 99%    General: Pt is alert, awake, not in acute distress Cardiovascular: RRR, S1/S2 +, no rubs, no gallops Respiratory: CTA bilaterally, no wheezing, no rhonchi Abdominal: Soft, NT, ND, bowel sounds + Extremities: no edema, no cyanosis    The results of significant diagnostics from this hospitalization  (including imaging, microbiology, ancillary and laboratory) are listed below for reference.     Microbiology: Recent Results (from the past 240 hour(s))  MRSA PCR Screening     Status: None   Collection Time: 11/30/15  5:00 PM  Result Value Ref Range Status   MRSA by PCR NEGATIVE NEGATIVE Final    Comment:        The GeneXpert MRSA Assay (FDA approved for NASAL specimens only), is one component of a comprehensive MRSA colonization surveillance program. It is not intended to diagnose MRSA infection nor to guide or monitor treatment for MRSA infections.      Labs: BNP (last 3 results) No results for input(s): BNP in the last 8760 hours. Basic Metabolic Panel:  Recent Labs Lab 11/30/15 1358 12/01/15 0309  NA 139 138  K 3.3* 3.5  CL 107 109  CO2 23 23  GLUCOSE 121* 100*  BUN 35* 26*  CREATININE 1.68* 1.07  CALCIUM 8.8* 7.8*   Liver Function  Tests:  Recent Labs Lab 11/30/15 1407  AST 16  ALT 19  ALKPHOS 68  BILITOT 0.5  PROT 6.2*  ALBUMIN 3.6   No results for input(s): LIPASE, AMYLASE in the last 168 hours. No results for input(s): AMMONIA in the last 168 hours. CBC:  Recent Labs Lab 11/30/15 1358 11/30/15 1929 12/01/15 0309 12/01/15 1935 12/02/15 0435  WBC 11.3*  --   --   --  9.7  HGB 6.3* 6.4* 7.3* 9.3* 9.2*  HCT 19.9* 20.4* 23.0* 29.5* 28.3*  MCV 94.3  --   --   --  84.5  PLT 460*  --   --   --  349   Cardiac Enzymes: No results for input(s): CKTOTAL, CKMB, CKMBINDEX, TROPONINI in the last 168 hours. BNP: Invalid input(s): POCBNP CBG: No results for input(s): GLUCAP in the last 168 hours. D-Dimer No results for input(s): DDIMER in the last 72 hours. Hgb A1c No results for input(s): HGBA1C in the last 72 hours. Lipid Profile No results for input(s): CHOL, HDL, LDLCALC, TRIG, CHOLHDL, LDLDIRECT in the last 72 hours. Thyroid function studies No results for input(s): TSH, T4TOTAL, T3FREE, THYROIDAB in the last 72 hours.  Invalid input(s):  FREET3 Anemia work up No results for input(s): VITAMINB12, FOLATE, FERRITIN, TIBC, IRON, RETICCTPCT in the last 72 hours. Urinalysis No results found for: COLORURINE, APPEARANCEUR, LABSPEC, PHURINE, GLUCOSEU, HGBUR, BILIRUBINUR, KETONESUR, PROTEINUR, UROBILINOGEN, NITRITE, LEUKOCYTESUR Sepsis Labs Invalid input(s): PROCALCITONIN,  WBC,  LACTICIDVEN Microbiology Recent Results (from the past 240 hour(s))  MRSA PCR Screening     Status: None   Collection Time: 11/30/15  5:00 PM  Result Value Ref Range Status   MRSA by PCR NEGATIVE NEGATIVE Final    Comment:        The GeneXpert MRSA Assay (FDA approved for NASAL specimens only), is one component of a comprehensive MRSA colonization surveillance program. It is not intended to diagnose MRSA infection nor to guide or monitor treatment for MRSA infections.      Time coordinating discharge: Over 30 minutes  SIGNED:   Eddie NorthHUNGEL, Herny Scurlock, MD  [facility] 12/02/2015, 11:31 AM Pager   If 7PM-7AM, please contact night-coverage [email or web address] Password [email or web address]

## 2015-12-03 ENCOUNTER — Encounter: Payer: Self-pay | Admitting: Gastroenterology

## 2015-12-03 ENCOUNTER — Encounter (HOSPITAL_COMMUNITY): Payer: Self-pay | Admitting: Gastroenterology

## 2015-12-13 ENCOUNTER — Other Ambulatory Visit: Payer: Self-pay | Admitting: Family Medicine

## 2015-12-13 DIAGNOSIS — I7789 Other specified disorders of arteries and arterioles: Secondary | ICD-10-CM

## 2015-12-17 ENCOUNTER — Ambulatory Visit
Admission: RE | Admit: 2015-12-17 | Discharge: 2015-12-17 | Disposition: A | Discharge status: Home / Self Care | Payer: Medicare Other | Source: Ambulatory Visit | Attending: Family Medicine | Admitting: Family Medicine

## 2015-12-17 DIAGNOSIS — I7789 Other specified disorders of arteries and arterioles: Secondary | ICD-10-CM

## 2015-12-17 MED ORDER — IOPAMIDOL (ISOVUE-300) INJECTION 61%
75.0000 mL | Freq: Once | INTRAVENOUS | Status: AC | PRN
  Administered 2015-12-17: 75 mL via INTRAVENOUS

## 2015-12-24 ENCOUNTER — Encounter: Payer: Self-pay | Admitting: *Deleted

## 2015-12-24 DIAGNOSIS — I712 Thoracic aortic aneurysm, without rupture: Secondary | ICD-10-CM | POA: Insufficient documentation

## 2015-12-24 DIAGNOSIS — I7122 Aneurysm of the aortic arch, without rupture: Secondary | ICD-10-CM | POA: Insufficient documentation

## 2015-12-24 DIAGNOSIS — I7121 Aneurysm of the ascending aorta, without rupture: Secondary | ICD-10-CM | POA: Insufficient documentation

## 2015-12-28 ENCOUNTER — Ambulatory Visit: Payer: Medicare Other | Admitting: Gastroenterology

## 2016-01-04 ENCOUNTER — Encounter: Payer: Medicare Other | Admitting: Thoracic Surgery (Cardiothoracic Vascular Surgery)

## 2016-01-25 ENCOUNTER — Ambulatory Visit (INDEPENDENT_AMBULATORY_CARE_PROVIDER_SITE_OTHER): Payer: Medicare Other | Admitting: Gastroenterology

## 2016-01-25 ENCOUNTER — Encounter: Payer: Self-pay | Admitting: Gastroenterology

## 2016-01-25 VITALS — BP 144/100 | HR 76 | Ht 67.0 in | Wt 155.0 lb

## 2016-01-25 DIAGNOSIS — K2270 Barrett's esophagus without dysplasia: Secondary | ICD-10-CM

## 2016-01-25 DIAGNOSIS — K269 Duodenal ulcer, unspecified as acute or chronic, without hemorrhage or perforation: Secondary | ICD-10-CM

## 2016-01-25 DIAGNOSIS — D62 Acute posthemorrhagic anemia: Secondary | ICD-10-CM | POA: Diagnosis not present

## 2016-01-25 NOTE — Progress Notes (Signed)
[facility] Progress Note  Chief Complaint: Duodenal ulcer  Subjective  History: [name] follows up after his mid July hospital stay for GI bleeding. He was found to have a clean-based duodenal ulcer most likely caused by diclofenac, which has since been discontinued. He had anemia of acute GI blood loss with a discharge hemoglobin of about 9. He and his wife stated it was checked again by his local PCP about 1-2 weeks ago, and then he was started on iron. He is due to see [facility] next week. [name] denies abdominal pain, nausea, vomiting, melena or hematochezia. He has remained on pantoprazole 40 mg once a day until the prescription ran out 7-10 days ago. ROS: Cardiovascular:  no chest pain Respiratory: no dyspnea  The patient's Past Medical, Family and Social History were reviewed and are on file in the EMR.  Objective:  Med list reviewed  Vital signs in last 24 hrs: Vitals:   01/25/16 1119 01/25/16 1142  BP: (!) 162/100 (!) 144/100  Pulse: 76     Physical Exam   HEENT: sclera anicteric, oral mucosa moist without lesions  Neck: supple, no thyromegaly, JVD or lymphadenopathy  Cardiac: RRR without murmurs, S1S2 heard, no peripheral edema  Pulm: clear to auscultation bilaterally, normal RR and effort noted  Abdomen: soft, No tenderness, with active bowel sounds. No guarding or palpable hepatosplenomegaly.  Skin; warm and dry, no jaundice or rash He has an antalgic gait and an orthotic on his ankle, but is able to get on the exam table without assistance.  Recent Labs:  Gastric biopsy negative for H. pylori Esophageal biopsy positive for short segment Barrett's esophagus without dysplasia.   @ASSESSMENTPLANBEGIN @ Assessment: Encounter Diagnoses  Name Primary?  . Duodenal ulcer Yes  . Barrett's esophagus without dysplasia   . Acute blood loss anemia    His ulcer is most likely healed at this point since he has been off NSAIDs and on PPI with no H.  pylori found on biopsy. He has lately been fatigued, which seems most likely due to his anemia. Additional time on iron should help with that. I wrote on his diagnoses on the AVS so his [facility] PCP would have it. I also would like and prescribed him omeprazole 20 mg once daily for his Barrett's esophagus. We will recall him in 2 years for EGD.  Total time 25 minutes, over half spent in counseling and coordination of care.   Donald PitterHenry L Danis Moran

## 2016-01-25 NOTE — Patient Instructions (Addendum)
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  Will be recalled 2 yrs for EGD .  Anemic from recent GI bleed.  On iron supplements and needs CBC checked 3-4 weeks from last check by primary care.  If you are age 72 or older, your body mass index should be between 23-30. Your Body mass index is 24.28 kg/m. If this is out of the aforementioned range listed, please consider follow up with your Primary Care Provider.  If you are age 72 or younger, your body mass index should be between 19-25. Your Body mass index is 24.28 kg/m. If this is out of the aformentioned range listed, please consider follow up with your Primary Care Provider.

## 2016-02-15 ENCOUNTER — Institutional Professional Consult (permissible substitution) (INDEPENDENT_AMBULATORY_CARE_PROVIDER_SITE_OTHER): Payer: Medicare Other | Admitting: Thoracic Surgery (Cardiothoracic Vascular Surgery)

## 2016-02-15 ENCOUNTER — Encounter: Payer: Self-pay | Admitting: Thoracic Surgery (Cardiothoracic Vascular Surgery)

## 2016-02-15 VITALS — BP 112/80 | HR 110 | Resp 18 | Ht 67.0 in | Wt 155.0 lb

## 2016-02-15 DIAGNOSIS — I719 Aortic aneurysm of unspecified site, without rupture: Secondary | ICD-10-CM | POA: Diagnosis not present

## 2016-02-15 DIAGNOSIS — I2510 Atherosclerotic heart disease of native coronary artery without angina pectoris: Secondary | ICD-10-CM | POA: Insufficient documentation

## 2016-02-15 DIAGNOSIS — I712 Thoracic aortic aneurysm, without rupture: Secondary | ICD-10-CM | POA: Diagnosis not present

## 2016-02-15 DIAGNOSIS — I7121 Aneurysm of the ascending aorta, without rupture: Secondary | ICD-10-CM

## 2016-02-15 DIAGNOSIS — I700 Atherosclerosis of aorta: Secondary | ICD-10-CM | POA: Insufficient documentation

## 2016-02-15 NOTE — Progress Notes (Signed)
PCP is Kaleen Mask, MD Referring Provider is Kaleen Mask, *  Chief Complaint  Patient presents with  . 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  He recently was hospitalized with a gastrointestinal bleed. He had an upper endoscopy which showed a bleeding ulcer. During his hospitalization he had a chest x-ray which showed an abnormal aortic contour.  He saw Dr. Jeannetta Nap. A CT angiogram of the chest was done. 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  He says his blood pressure is highly variable when he was hospitalized. It has been much better controlled since he has been home. He does have a blood pressure cuff at home.  He still smokes 1 pack of cigarettes daily and has for over 50 years.  He denies any chest pain, shortness of breath, peripheral edema, or back pain.   Past Medical History:  Diagnosis Date  . Coronary artery disease   . Degenerative arthritis of spine   . History of blood transfusion 11/2015   Bleeding ulcer  . Hypertension   . Right foot drop     Past Surgical History:  Procedure Laterality Date  . BACK SURGERY    . CATARACT EXTRACTION, BILATERAL    . CHOLECYSTECTOMY OPEN    . CORONARY STENT PLACEMENT    . ESOPHAGOGASTRODUODENOSCOPY N/A 12/01/2015   Procedure: ESOPHAGOGASTRODUODENOSCOPY (EGD);  Surgeon: Sherrilyn Rist, MD;  Location: [facility];  Service: Endoscopy;  Laterality: N/A;  . FOOT SURGERY      Family History  Problem Relation Age of Onset  . Heart attack    . Coronary artery disease      Social History Social History   Substance Use Topics  . Smoking status: Current Every Day Smoker    Packs/day: 1.00    Years: 54.00  . Smokeless tobacco: Not on file  . Alcohol use No    Current Outpatient Prescriptions  Medication Sig Dispense Refill  . amLODipine (NORVASC) 10 MG tablet Take 10 mg by mouth daily.    . diphenoxylate-atropine (LOMOTIL) 2.5-0.025 MG tablet Take by mouth 4 (four) times daily as needed for diarrhea or loose stools.    . DULoxetine (CYMBALTA) 60 MG capsule Take 60 mg by mouth daily.    . ferrous sulfate 325 (65 FE) MG EC tablet Take 325 mg by mouth daily.    Marland Kitchen gabapentin (NEURONTIN) 300 MG capsule Take 300 mg by mouth 2 (two) times daily. 2 caps bid    . nitroGLYCERIN (NITROSTAT) 0.4 MG SL tablet Place 0.4 mg under the tongue every 5 (five) minutes as needed for chest pain.     . tamsulosin (FLOMAX) 0.4 MG CAPS capsule Take 0.4 mg by mouth daily.    . traZODone (DESYREL) 150 MG tablet Take by mouth at bedtime.     No current facility-administered medications for this visit.     No Known Allergies  Review of Systems  Constitutional: Positive for unexpected weight change (Weight loss since hospitalization). Negative for activity change and appetite change.  HENT: Positive for dental problem (Dentures). Negative for trouble swallowing and voice change.   Eyes: Negative  for visual disturbance.  Respiratory: Negative for shortness of breath.   Cardiovascular: Negative for chest pain, palpitations and leg swelling.  Gastrointestinal: Positive for blood in stool. Negative for abdominal pain.  Genitourinary: Positive for frequency. Negative for dysuria.  Musculoskeletal: Positive for gait problem (Right foot drop, walks with cane). Negative for back pain and neck pain.  Neurological: Positive for weakness (Right foot drop). Negative for syncope.  All other systems reviewed and are negative.   BP 112/80 (BP Location: Right Arm, Patient Position: Sitting, Cuff Size: Normal)   Pulse (!) 110    Resp 18   Ht 5\' 7"  (1.702 m)   Wt 155 lb (70.3 kg)   SpO2 98% Comment: RA  BMI 24.28 kg/m  Physical Exam  Constitutional: He is oriented to person, place, and time. He appears well-developed and well-nourished. No distress.  HENT:  Head: Normocephalic and atraumatic.  Mouth/Throat: No oropharyngeal exudate.  Eyes: Conjunctivae and EOM are normal. No scleral icterus.  Neck: Neck supple. No thyromegaly present.  No carotid bruits  Cardiovascular: Regular rhythm and normal heart sounds.   No murmur heard. Tachycardic at 110  Pulmonary/Chest: Effort normal and breath sounds normal. No respiratory distress. He has no wheezes. He has no rales.  Abdominal: Soft. He exhibits no distension. There is no tenderness.  Musculoskeletal:  Atrophy right calf, brace in place right leg  Lymphadenopathy:    He has no cervical adenopathy.  Neurological: He is alert and oriented to person, place, and time. No cranial nerve deficit.  Right foot drop, otherwise motor intact  Skin: Skin is warm and dry.  Vitals reviewed.    Diagnostic Tests: CT CHEST WITH CONTRAST TECHNIQUE: Multidetector CT imaging of the chest was performed during intravenous contrast administration. CONTRAST:  75mL ISOVUE-300 IOPAMIDOL (ISOVUE-300) INJECTION 61% COMPARISON:  Radiographs of November 30, 2015. FINDINGS: Cardiovascular: 4.2 cm ascending thoracic aortic aneurysm is noted. The distal portion of the thoracic aortic arch measures 5.1 cm in maximum diameter with mural thrombus. Aortic atherosclerosis is noted. No dissection is noted. Coronary artery calcifications are noted. Mediastinum/Nodes: 3.2 cm complex lesion is seen in right thyroid lobe. No significant mediastinal mass or adenopathy is noted. Lungs/Pleura: No pneumothorax or pleural effusion is noted. No acute pulmonary disease is noted. Upper Abdomen: Mild intrahepatic biliary dilatation is noted most consistent with post cholecystectomy status. Bilateral  renal cysts are noted. Musculoskeletal: Multilevel degenerative disc disease is noted throughout the thoracic spine. IMPRESSION: Coronary artery calcifications are noted suggesting coronary artery disease. Aortic atherosclerosis. 3.2 cm complex lesion seen in right thyroid lobe. Dedicated thyroid ultrasound is recommended for further evaluation. 4.2 cm ascending thoracic aortic aneurysm is noted. 5.1 cm aneurysmal dilatation involving the distal portion of the thoracic aortic arch. Recommend semi-annual imaging followup by CTA or MRA and referral to cardiothoracic surgery if not already obtained. This recommendation follows 2010 ACCF/AHA/AATS/ACR/ASA/SCA/SCAI/SIR/STS/SVM Guidelines for the Diagnosis and Management of Patients With Thoracic Aortic Disease. Circulation. 2010; 121: e266-e36. Electronically Signed   By: Lupita RaiderJames  Green Jr, M.D.   On: 12/17/2015 08:55 I personally reviewed the CT chest and concur with the findings as noted above.  Impression: [name] is a 70 year old gentleman with a known history of coronary disease, hypertension, and severe back issues requiring for previous surgeries. He recently was hospitalized with an upper GI bleed due to an ulcer. He was incidentally noted to have an abnormal aortic contour on chest x-ray. A CT of the chest shows aortic atherosclerosis with a 4.2 cm ascending  aneurysm and a 5 cm descending aneurysm. There is some mural thrombus in the descending aneurysm.  In regards to the ascending aneurysm, it is 4.2 cm, there is no indication for operation, this would require follow-up at 1 year intervals.  In regards to the descending aneurysm, it is larger at 5.1 cm. This is in a classic location for a traumatic pseudoaneurysm, but on questioning he has no history of significant falls, motor vehicle accident, or other deceleration type intact. The typical indication for surgery in this location is 5.5 cm. There is no indication for surgery  presently but he needs closer follow-up. His scan was done at the end of July, so it is now arty been 2 months. He needs a 6 month follow-up, which will be in January. We will plan to scan his chest, abdomen, and pelvis at that time to get a better idea if he might be a candidate for stent grafting.  Hypertension- the importance of blood pressure control was emphasized.  Smoking- he smokes a pack of cigarettes daily and has done so for over 50 years. The importance of tobacco cessation was emphasized. He did not express an interest in quitting.  He has wife were informed of the symptoms of severe back, neck or shoulder pain in the importance of immediate medical attention if those were to occur.  Plan: Quit smoking Blood pressure well controlled currently. Follow closely with home blood pressure cuff Return in 4 months with CT angiogram of chest, abdomen and pelvis (6 months from previous scan)   Loreli Slot, MD [facility] [phone]

## 2016-05-09 ENCOUNTER — Other Ambulatory Visit: Payer: Self-pay | Admitting: Thoracic Surgery (Cardiothoracic Vascular Surgery)

## 2016-05-09 DIAGNOSIS — I729 Aneurysm of unspecified site: Secondary | ICD-10-CM

## 2016-05-29 ENCOUNTER — Other Ambulatory Visit: Payer: Self-pay | Admitting: Geriatric Medicine

## 2016-05-30 ENCOUNTER — Other Ambulatory Visit: Payer: Self-pay | Admitting: Surgery

## 2016-05-30 ENCOUNTER — Other Ambulatory Visit: Payer: Self-pay | Admitting: Thoracic Surgery (Cardiothoracic Vascular Surgery)

## 2016-05-30 DIAGNOSIS — I712 Thoracic aortic aneurysm, without rupture, unspecified: Secondary | ICD-10-CM

## 2016-06-13 ENCOUNTER — Other Ambulatory Visit: Payer: Medicare Other

## 2016-06-13 ENCOUNTER — Ambulatory Visit: Payer: Medicare Other | Admitting: Thoracic Surgery (Cardiothoracic Vascular Surgery)

## 2019-04-22 ENCOUNTER — Other Ambulatory Visit: Payer: Self-pay

## 2019-04-22 ENCOUNTER — Other Ambulatory Visit: Payer: Self-pay | Admitting: Family Medicine

## 2019-04-22 ENCOUNTER — Ambulatory Visit
Admission: RE | Admit: 2019-04-22 | Discharge: 2019-04-22 | Disposition: A | Discharge status: Home / Self Care | Payer: Medicare Other | Source: Ambulatory Visit | Attending: Family Medicine | Admitting: Family Medicine

## 2019-04-22 DIAGNOSIS — R52 Pain, unspecified: Secondary | ICD-10-CM

## 2019-07-21 DEATH — deceased

## 2020-08-06 IMAGING — CR DG ANKLE COMPLETE 3+V*R*
3 series · 3 of 3 positions shown · non-contrast
Comparison: None.

CLINICAL DATA: Right ankle pain and foot drop.

EXAM:
RIGHT ANKLE - COMPLETE 3+ VIEW

[x ankle ap right]
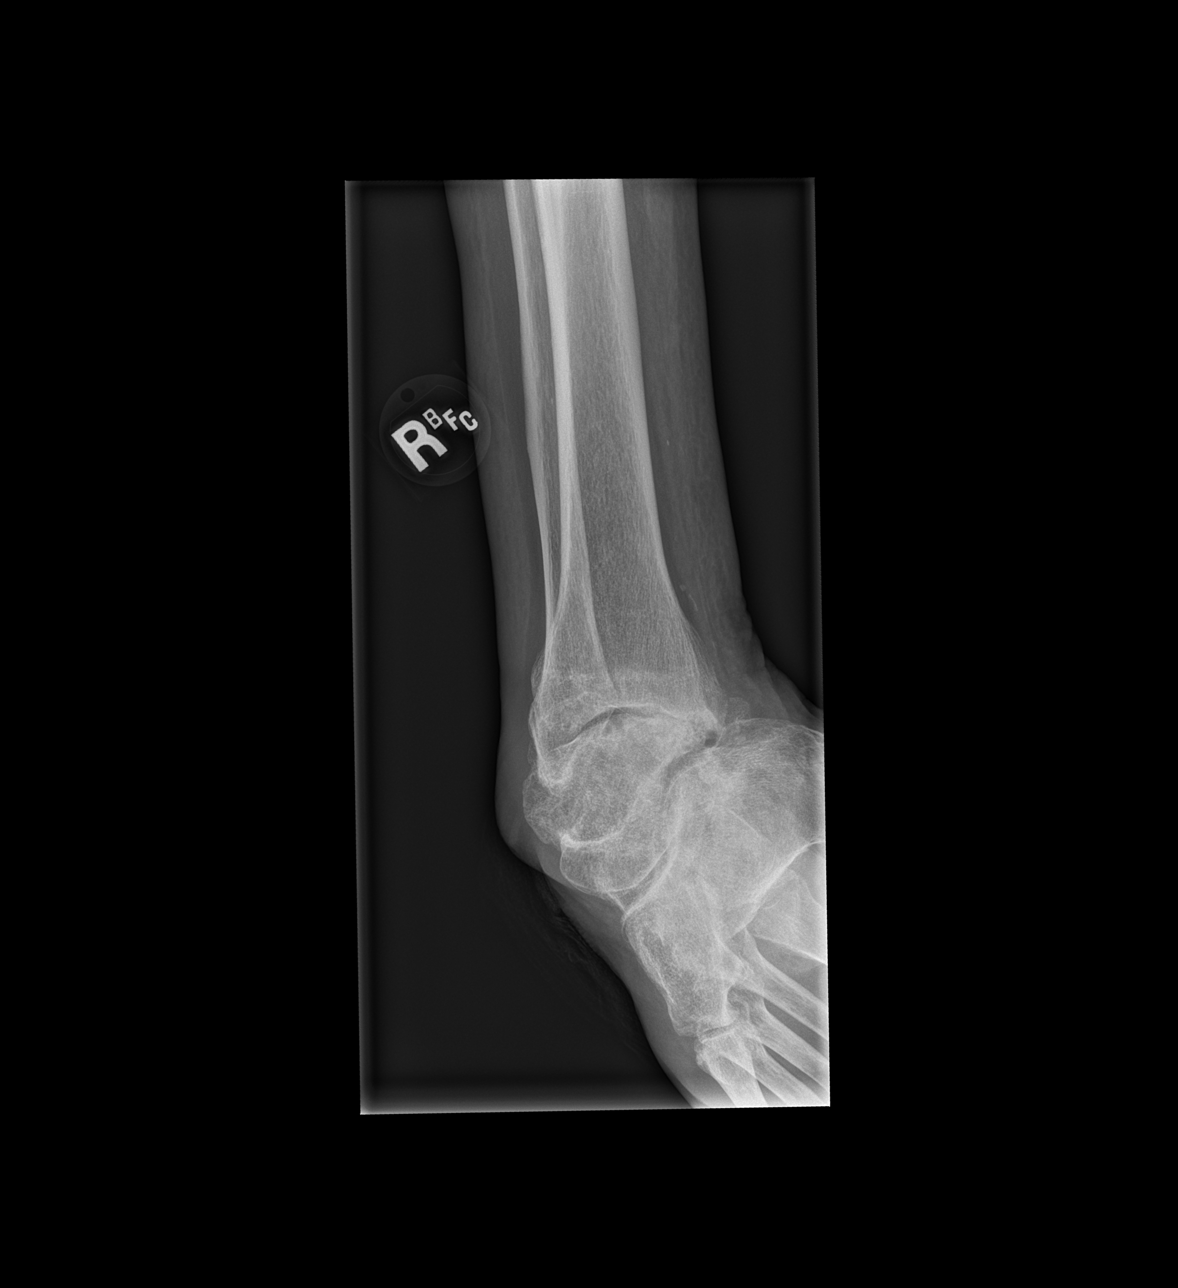

[x ankle obl right]
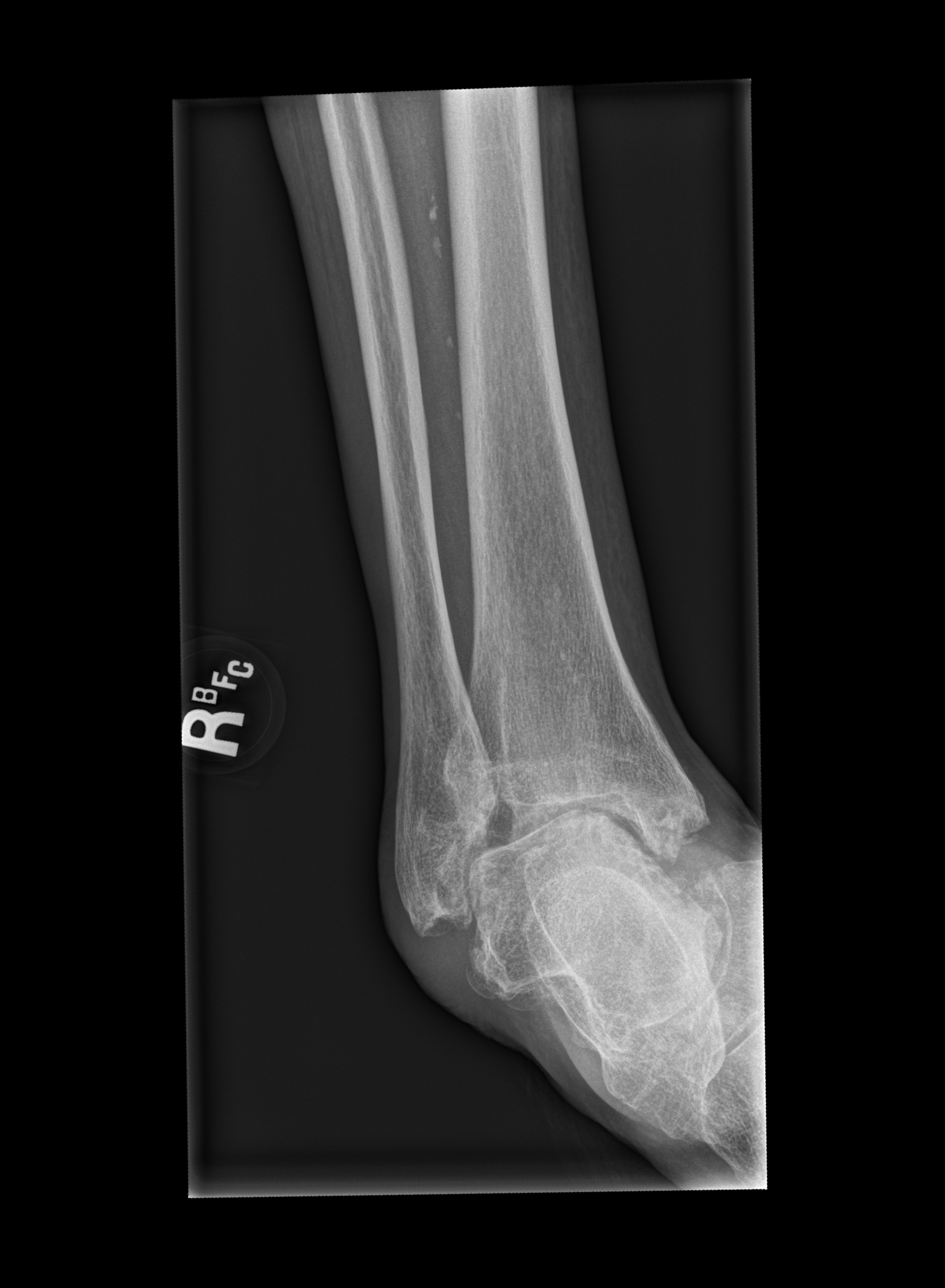

[x ankle lat right]
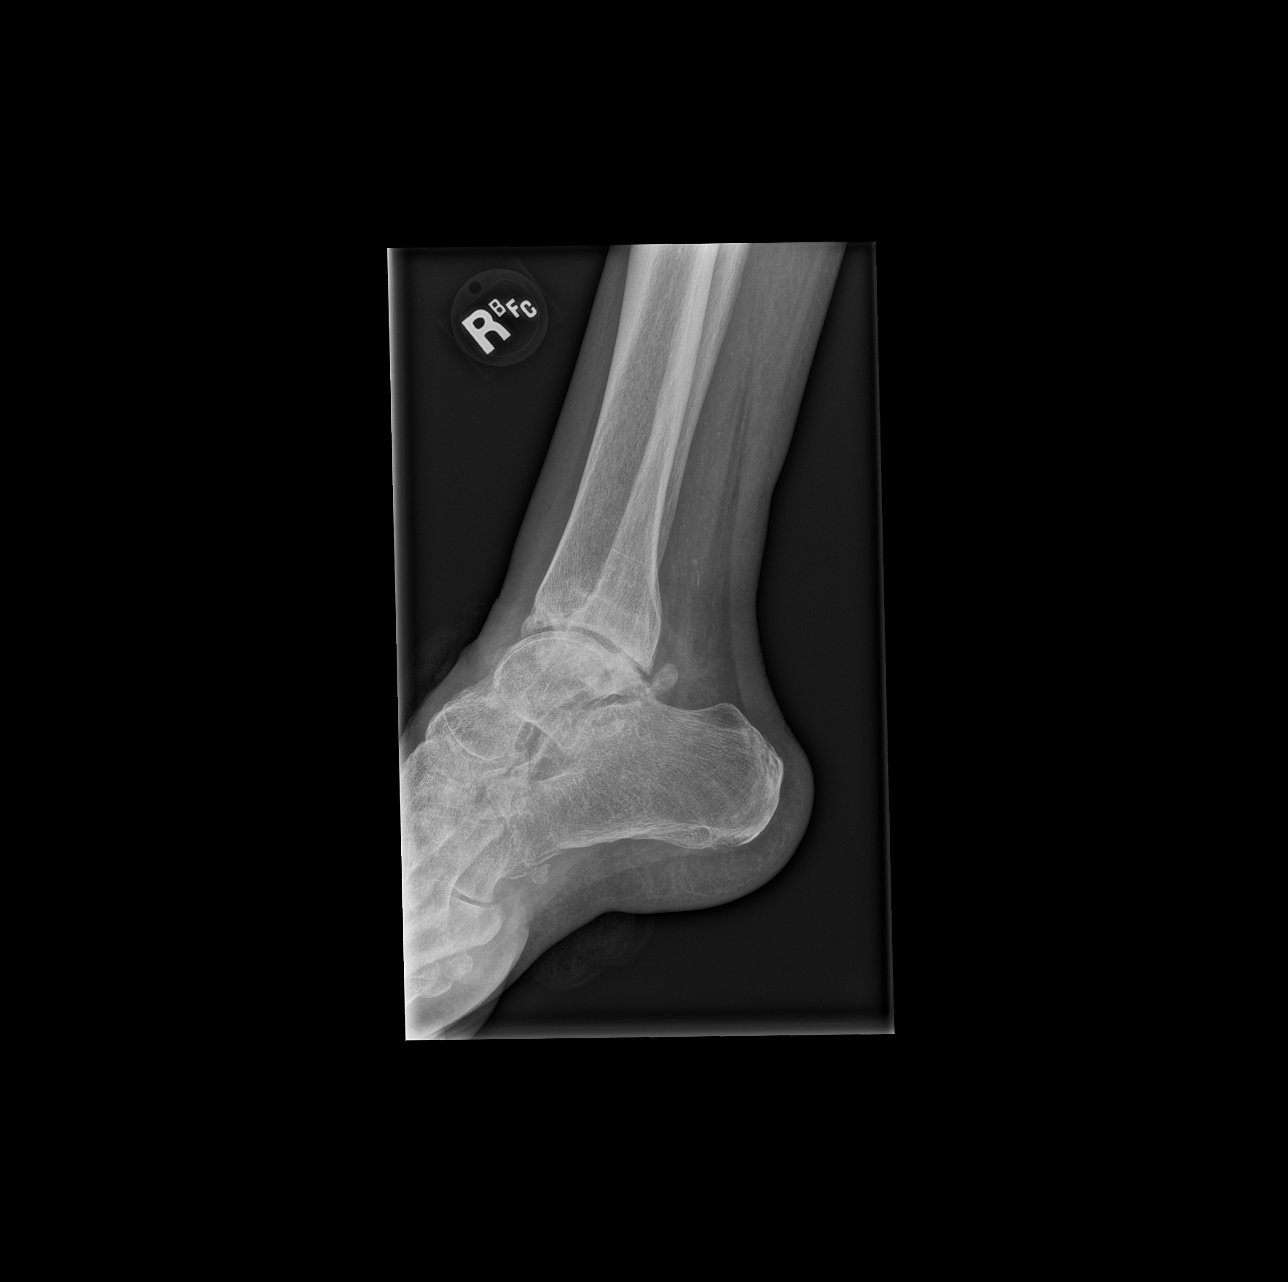

[3 of 3 positions shown; findings below may reference images not displayed]

FINDINGS: No acute bony or joint abnormality is seen. There is medial to
lateral tilt of the talar dome. The patient has tibiotalar and
subtalar osteoarthritis.
IMPRESSION: 1. No acute abnormality.
2. Tibiotalar and subtalar osteoarthritis.
3. Medial to lateral tilt of the talar dome may be due to old
trauma.
